# Patient Record
Sex: Male | Born: 1945 | Race: White | Hispanic: No | Smoking: Current every day smoker
Health system: Southern US, Community
[De-identification: ages and names within clinical notes are randomized; demographics above are authoritative.]

## PROBLEM LIST (undated history)

## (undated) DIAGNOSIS — I10 Essential (primary) hypertension: Secondary | ICD-10-CM

## (undated) DIAGNOSIS — M109 Gout, unspecified: Secondary | ICD-10-CM

## (undated) DIAGNOSIS — K219 Gastro-esophageal reflux disease without esophagitis: Secondary | ICD-10-CM

## (undated) HISTORY — DX: Gastro-esophageal reflux disease without esophagitis: K21.9

## (undated) HISTORY — DX: Gout, unspecified: M10.9

## (undated) HISTORY — DX: Essential (primary) hypertension: I10

---

## 1972-05-22 HISTORY — PX: RECONSTRUCTION OF NOSE: SHX2301

## 2012-06-06 LAB — PULMONARY FUNCTION TEST

## 2014-03-02 DIAGNOSIS — L120 Bullous pemphigoid: Secondary | ICD-10-CM | POA: Insufficient documentation

## 2014-08-04 DIAGNOSIS — G8929 Other chronic pain: Secondary | ICD-10-CM | POA: Diagnosis not present

## 2014-08-04 DIAGNOSIS — M549 Dorsalgia, unspecified: Secondary | ICD-10-CM | POA: Diagnosis not present

## 2014-08-04 DIAGNOSIS — M25562 Pain in left knee: Secondary | ICD-10-CM | POA: Diagnosis not present

## 2014-08-04 DIAGNOSIS — G894 Chronic pain syndrome: Secondary | ICD-10-CM | POA: Diagnosis not present

## 2014-09-01 DIAGNOSIS — L219 Seborrheic dermatitis, unspecified: Secondary | ICD-10-CM | POA: Diagnosis not present

## 2014-09-01 DIAGNOSIS — L120 Bullous pemphigoid: Secondary | ICD-10-CM | POA: Diagnosis not present

## 2014-09-01 DIAGNOSIS — L570 Actinic keratosis: Secondary | ICD-10-CM | POA: Diagnosis not present

## 2014-09-01 DIAGNOSIS — L304 Erythema intertrigo: Secondary | ICD-10-CM | POA: Diagnosis not present

## 2014-09-01 DIAGNOSIS — Z85828 Personal history of other malignant neoplasm of skin: Secondary | ICD-10-CM | POA: Diagnosis not present

## 2014-09-14 DIAGNOSIS — N400 Enlarged prostate without lower urinary tract symptoms: Secondary | ICD-10-CM | POA: Diagnosis not present

## 2014-10-23 DIAGNOSIS — Z5181 Encounter for therapeutic drug level monitoring: Secondary | ICD-10-CM | POA: Diagnosis not present

## 2014-10-23 DIAGNOSIS — M549 Dorsalgia, unspecified: Secondary | ICD-10-CM | POA: Diagnosis not present

## 2014-10-23 DIAGNOSIS — M25562 Pain in left knee: Secondary | ICD-10-CM | POA: Diagnosis not present

## 2014-10-23 DIAGNOSIS — Z79899 Other long term (current) drug therapy: Secondary | ICD-10-CM | POA: Diagnosis not present

## 2014-10-23 DIAGNOSIS — M25561 Pain in right knee: Secondary | ICD-10-CM | POA: Diagnosis not present

## 2014-10-23 DIAGNOSIS — G894 Chronic pain syndrome: Secondary | ICD-10-CM | POA: Diagnosis not present

## 2014-12-02 DIAGNOSIS — I10 Essential (primary) hypertension: Secondary | ICD-10-CM | POA: Diagnosis not present

## 2014-12-02 DIAGNOSIS — R42 Dizziness and giddiness: Secondary | ICD-10-CM | POA: Diagnosis not present

## 2014-12-02 DIAGNOSIS — R110 Nausea: Secondary | ICD-10-CM | POA: Diagnosis not present

## 2014-12-20 DIAGNOSIS — N400 Enlarged prostate without lower urinary tract symptoms: Secondary | ICD-10-CM | POA: Insufficient documentation

## 2014-12-20 DIAGNOSIS — F1120 Opioid dependence, uncomplicated: Secondary | ICD-10-CM | POA: Insufficient documentation

## 2014-12-20 DIAGNOSIS — L570 Actinic keratosis: Secondary | ICD-10-CM | POA: Insufficient documentation

## 2014-12-20 DIAGNOSIS — M48061 Spinal stenosis, lumbar region without neurogenic claudication: Secondary | ICD-10-CM | POA: Insufficient documentation

## 2014-12-20 NOTE — Progress Notes (Signed)
Subjective:  Patient ID: Todd Snow, male    DOB: 1945/06/18  Age: 69 y.o. MRN: [id number]  CC: Hypertension; Pain; and Gastrophageal Reflux   HPI Jayvin Hartt presents for follow up of back pain. Now seeing Pain clinic, Deana Smart, PA. He continues to use oxycodone per their supervision. Pain is daily and moderately severe relieved adequately by oxycodone.  Patient in for follow-up of elevated cholesterol. Doing well without complaints on current medication. Denies side effects of statin including myalgia and arthralgia and nausea. Also in today for liver function testing. Currently no chest pain, shortness of breath or other cardiovascular related symptoms noted.  Patient in for follow-up of GERD. Currently asymptomatic taking  PPI daily. There is no chest pain or heartburn. No hematemesis and no melena. No dysphagia or choking. Onset is remote. Progression is stable. Complicating factors, none. Patient in for follow-up of elevated cholesterol. Doing well without complaints on current medication. Denies side effects of statin including myalgia and arthralgia and nausea. Also in today for liver function testing. Currently no chest pain, shortness of breath or other cardiovascular related symptoms noted.  He recently saw urology for urinary hesitancy and nocturia. This has improved. However he could not tolerate Flomax and could not afford Rapaflo. He is now taking saw palmetto over-the-counter with good result.   History Taiten has a past medical history of GERD (gastroesophageal reflux disease) and Hypertension.   He has no past surgical history on file.   His family history includes COPD in his father; Cancer in his sister.He reports that he quit smoking about 15 years ago. His smoking use included Cigarettes. He started smoking about 45 years ago. He smoked 20.00 packs per day. He does not have any smokeless tobacco history on file. He reports that he drinks about 6.0 oz of alcohol per  week. He reports that he does not use illicit drugs.  No outpatient prescriptions prior to visit.   No facility-administered medications prior to visit.    ROS Review of Systems  Constitutional: Negative for fever, chills and diaphoresis.  HENT: Negative for congestion, rhinorrhea and sore throat.   Respiratory: Negative for cough, shortness of breath and wheezing.   Cardiovascular: Negative for chest pain.  Gastrointestinal: Positive for constipation (hard BMs for 3 weeks. Relief with otc stool softener). Negative for nausea, vomiting, abdominal pain, diarrhea and abdominal distention.  Genitourinary: Negative for dysuria and frequency.  Musculoskeletal: Positive for myalgias and joint swelling. Negative for arthralgias.  Skin: Negative for rash.  Neurological: Negative for headaches.    Objective:  BP 112/72 mmHg  Pulse 73  Temp(Src) 97.1 F (36.2 C) (Oral)  Ht 5\' 5"  (1.651 m)  Wt 195 lb 6.4 oz (88.633 kg)  BMI 32.52 kg/m2  BP Readings from Last 3 Encounters:  12/21/14 112/72    Wt Readings from Last 3 Encounters:  12/21/14 195 lb 6.4 oz (88.633 kg)     Physical Exam  Constitutional: He is oriented to person, place, and time. He appears well-developed and well-nourished. No distress.  HENT:  Head: Normocephalic and atraumatic.  Right Ear: External ear normal.  Left Ear: External ear normal.  Nose: Nose normal.  Mouth/Throat: Oropharynx is clear and moist.  Eyes: Conjunctivae and EOM are normal. Pupils are equal, round, and reactive to light.  Neck: Normal range of motion. Neck supple. No thyromegaly present.  Cardiovascular: Normal rate, regular rhythm and normal heart sounds.   No murmur heard. Pulmonary/Chest: Effort normal and breath sounds normal. No  respiratory distress. He has no wheezes. He has no rales.  Abdominal: Soft. Bowel sounds are normal. He exhibits no distension. There is no tenderness.  Lymphadenopathy:    He has no cervical adenopathy.    Neurological: He is alert and oriented to person, place, and time. He has normal reflexes.  Skin: Skin is warm and dry.  Psychiatric: He has a normal mood and affect. His behavior is normal. Judgment and thought content normal.    No results found for: HGBA1C  No results found for: WBC, HGB, HCT, PLT, GLUCOSE, CHOL, TRIG, HDL, LDLDIRECT, LDLCALC, ALT, AST, NA, K, CL, CREATININE, BUN, CO2, TSH, PSA, INR, GLUF, HGBA1C, MICROALBUR  Patient was never admitted.  Assessment & Plan:   Dimas was seen today for hypertension, pain and gastrophageal reflux.  Diagnoses and all orders for this visit:  BPH (benign prostatic hyperplasia)  Spinal stenosis of lumbar region  Opiate dependence, continuous  Essential hypertension, benign  Constipation, unspecified constipation type  Other orders -     psyllium (METAMUCIL SMOOTH TEXTURE) 28 % packet; One or two  tablespoons twice daily to treat and prevent constipation   I have discontinued Mr. Porter glucosamine-chondroitin and silodosin. I am also having him start on psyllium. Additionally, I am having him maintain his aspirin, hydrocortisone, naproxen sodium, ketoconazole, nystatin cream, omeprazole, Oxycodone HCl, sildenafil, triamcinolone cream, diazepam, and triamterene-hydrochlorothiazide.  Meds ordered this encounter  Medications  . aspirin 81 MG chewable tablet    Sig: Chew 81 mg by mouth daily.  Marland Kitchen DISCONTD: glucosamine-chondroitin 500-400 MG tablet    Sig: Take 1 tablet by mouth 3 (three) times daily.  . hydrocortisone 2.5 % cream    Sig: Apply 1 application topically 2 (two) times daily.  . naproxen sodium (ANAPROX) 220 MG tablet    Sig: Take 220 mg by mouth 2 (two) times daily.  Marland Kitchen ketoconazole (NIZORAL) 2 % shampoo    Sig: Apply 1 application topically 2 (two) times daily.  Marland Kitchen nystatin cream (MYCOSTATIN)    Sig: Use bid for groin as needed.  Marland Kitchen omeprazole (PRILOSEC) 20 MG capsule    Sig:   . Oxycodone HCl 10 MG TABS    Sig:  Take 10 mg by mouth.  . sildenafil (VIAGRA) 50 MG tablet    Sig: Take 50 mg by mouth as needed.  Marland Kitchen DISCONTD: silodosin (RAPAFLO) 8 MG CAPS capsule    Sig: Take 8 mg by mouth daily.  Marland Kitchen triamcinolone cream (KENALOG) 0.1 %    Sig: Apply twice daily  . diazepam (VALIUM) 10 MG tablet    Sig: Take 1 tablet by mouth 2 (two) times daily as needed. For anxiety    Refill:  2  . triamterene-hydrochlorothiazide (MAXZIDE-25) 37.5-25 MG per tablet    Sig: Take 1 tablet by mouth daily.    Refill:  0  . psyllium (METAMUCIL SMOOTH TEXTURE) 28 % packet    Sig: One or two  tablespoons twice daily to treat and prevent constipation    Dispense:  1000 packet    Refill:  11     Follow-up: Return in about 6 months (around 06/23/2015).  Mechele Claude, M.D.

## 2014-12-21 ENCOUNTER — Encounter: Payer: Self-pay | Admitting: Family Medicine

## 2014-12-21 ENCOUNTER — Encounter (INDEPENDENT_AMBULATORY_CARE_PROVIDER_SITE_OTHER): Payer: Self-pay

## 2014-12-21 ENCOUNTER — Ambulatory Visit (INDEPENDENT_AMBULATORY_CARE_PROVIDER_SITE_OTHER): Payer: Medicare Other | Admitting: Family Medicine

## 2014-12-21 VITALS — BP 112/72 | HR 73 | Temp 97.1°F | Ht 65.0 in | Wt 195.4 lb

## 2014-12-21 DIAGNOSIS — M4806 Spinal stenosis, lumbar region: Secondary | ICD-10-CM

## 2014-12-21 DIAGNOSIS — K5900 Constipation, unspecified: Secondary | ICD-10-CM

## 2014-12-21 DIAGNOSIS — I10 Essential (primary) hypertension: Secondary | ICD-10-CM

## 2014-12-21 DIAGNOSIS — N400 Enlarged prostate without lower urinary tract symptoms: Secondary | ICD-10-CM | POA: Diagnosis not present

## 2014-12-21 DIAGNOSIS — F1120 Opioid dependence, uncomplicated: Secondary | ICD-10-CM

## 2014-12-21 DIAGNOSIS — M48061 Spinal stenosis, lumbar region without neurogenic claudication: Secondary | ICD-10-CM

## 2014-12-21 MED ORDER — PSYLLIUM 28 % PO PACK: PACK | ORAL | Status: DC

## 2014-12-25 ENCOUNTER — Other Ambulatory Visit: Payer: Self-pay | Admitting: Family Medicine

## 2014-12-25 MED ORDER — TRIAMTERENE-HCTZ 37.5-25 MG PO TABS: 1.0000 | ORAL_TABLET | Freq: Every day | ORAL | Status: DC

## 2014-12-25 NOTE — Telephone Encounter (Signed)
Medication refilled and pt notified.

## 2015-01-22 DIAGNOSIS — M25561 Pain in right knee: Secondary | ICD-10-CM | POA: Diagnosis not present

## 2015-01-22 DIAGNOSIS — M549 Dorsalgia, unspecified: Secondary | ICD-10-CM | POA: Diagnosis not present

## 2015-01-22 DIAGNOSIS — M25562 Pain in left knee: Secondary | ICD-10-CM | POA: Diagnosis not present

## 2015-01-22 DIAGNOSIS — G894 Chronic pain syndrome: Secondary | ICD-10-CM | POA: Diagnosis not present

## 2015-03-01 DIAGNOSIS — H527 Unspecified disorder of refraction: Secondary | ICD-10-CM | POA: Diagnosis not present

## 2015-03-04 DIAGNOSIS — L821 Other seborrheic keratosis: Secondary | ICD-10-CM | POA: Diagnosis not present

## 2015-03-04 DIAGNOSIS — L570 Actinic keratosis: Secondary | ICD-10-CM | POA: Diagnosis not present

## 2015-03-04 DIAGNOSIS — L723 Sebaceous cyst: Secondary | ICD-10-CM | POA: Diagnosis not present

## 2015-03-04 DIAGNOSIS — L120 Bullous pemphigoid: Secondary | ICD-10-CM | POA: Diagnosis not present

## 2015-03-04 DIAGNOSIS — Z85828 Personal history of other malignant neoplasm of skin: Secondary | ICD-10-CM | POA: Diagnosis not present

## 2015-03-25 DIAGNOSIS — L723 Sebaceous cyst: Secondary | ICD-10-CM | POA: Diagnosis not present

## 2015-03-25 DIAGNOSIS — L989 Disorder of the skin and subcutaneous tissue, unspecified: Secondary | ICD-10-CM | POA: Diagnosis not present

## 2015-03-25 DIAGNOSIS — L219 Seborrheic dermatitis, unspecified: Secondary | ICD-10-CM | POA: Diagnosis not present

## 2015-03-25 DIAGNOSIS — W57XXXA Bitten or stung by nonvenomous insect and other nonvenomous arthropods, initial encounter: Secondary | ICD-10-CM | POA: Diagnosis not present

## 2015-03-26 ENCOUNTER — Other Ambulatory Visit: Payer: Self-pay | Admitting: Family Medicine

## 2015-04-09 ENCOUNTER — Encounter: Payer: Self-pay | Admitting: Pediatrics

## 2015-04-09 ENCOUNTER — Ambulatory Visit (INDEPENDENT_AMBULATORY_CARE_PROVIDER_SITE_OTHER): Payer: Medicare Other

## 2015-04-09 ENCOUNTER — Ambulatory Visit (INDEPENDENT_AMBULATORY_CARE_PROVIDER_SITE_OTHER): Payer: Medicare Other | Admitting: Pediatrics

## 2015-04-09 VITALS — BP 138/96 | HR 93 | Temp 97.0°F | Ht 65.0 in | Wt 195.6 lb

## 2015-04-09 DIAGNOSIS — K5900 Constipation, unspecified: Secondary | ICD-10-CM

## 2015-04-09 DIAGNOSIS — R1084 Generalized abdominal pain: Secondary | ICD-10-CM

## 2015-04-09 DIAGNOSIS — K5730 Diverticulosis of large intestine without perforation or abscess without bleeding: Secondary | ICD-10-CM | POA: Diagnosis not present

## 2015-04-09 DIAGNOSIS — K567 Ileus, unspecified: Secondary | ICD-10-CM | POA: Diagnosis not present

## 2015-04-09 DIAGNOSIS — Z880 Allergy status to penicillin: Secondary | ICD-10-CM | POA: Diagnosis not present

## 2015-04-09 DIAGNOSIS — Z87891 Personal history of nicotine dependence: Secondary | ICD-10-CM | POA: Diagnosis not present

## 2015-04-09 DIAGNOSIS — Z888 Allergy status to other drugs, medicaments and biological substances status: Secondary | ICD-10-CM | POA: Diagnosis not present

## 2015-04-09 DIAGNOSIS — Z885 Allergy status to narcotic agent status: Secondary | ICD-10-CM | POA: Diagnosis not present

## 2015-04-09 DIAGNOSIS — E876 Hypokalemia: Secondary | ICD-10-CM | POA: Diagnosis not present

## 2015-04-09 DIAGNOSIS — K5660 Unspecified intestinal obstruction: Secondary | ICD-10-CM | POA: Diagnosis not present

## 2015-04-09 DIAGNOSIS — N281 Cyst of kidney, acquired: Secondary | ICD-10-CM | POA: Diagnosis not present

## 2015-04-09 DIAGNOSIS — E785 Hyperlipidemia, unspecified: Secondary | ICD-10-CM | POA: Diagnosis not present

## 2015-04-09 DIAGNOSIS — Z85828 Personal history of other malignant neoplasm of skin: Secondary | ICD-10-CM | POA: Diagnosis not present

## 2015-04-09 DIAGNOSIS — S21219A Laceration without foreign body of unspecified back wall of thorax without penetration into thoracic cavity, initial encounter: Secondary | ICD-10-CM

## 2015-04-09 DIAGNOSIS — IMO0002 Reserved for concepts with insufficient information to code with codable children: Secondary | ICD-10-CM

## 2015-04-09 DIAGNOSIS — K219 Gastro-esophageal reflux disease without esophagitis: Secondary | ICD-10-CM | POA: Diagnosis not present

## 2015-04-09 DIAGNOSIS — R188 Other ascites: Secondary | ICD-10-CM | POA: Diagnosis not present

## 2015-04-09 DIAGNOSIS — K5669 Other intestinal obstruction: Secondary | ICD-10-CM | POA: Diagnosis not present

## 2015-04-09 LAB — CBC
HEMATOCRIT: 47.8 % (ref 37.5–51.0)
Hemoglobin: 17 g/dL (ref 12.6–17.7)
MCH: 33.1 pg — ABNORMAL HIGH (ref 26.6–33.0)
MCHC: 35.6 g/dL (ref 31.5–35.7)
MCV: 93 fL (ref 79–97)
PLATELETS: 325 10*3/uL (ref 150–379)
RBC: 5.14 x10E6/uL (ref 4.14–5.80)
RDW: 13.1 % (ref 12.3–15.4)
WBC: 14.6 10*3/uL — AB (ref 3.4–10.8)

## 2015-04-09 LAB — CMP14+EGFR
ALBUMIN: 4.6 g/dL (ref 3.5–4.8)
ALK PHOS: 91 IU/L (ref 39–117)
ALT: 16 IU/L (ref 0–44)
AST: 18 IU/L (ref 0–40)
Albumin/Globulin Ratio: 1.9 (ref 1.1–2.5)
BILIRUBIN TOTAL: 1 mg/dL (ref 0.0–1.2)
BUN / CREAT RATIO: 14 (ref 10–22)
BUN: 14 mg/dL (ref 8–27)
CHLORIDE: 94 mmol/L — AB (ref 97–106)
CO2: 32 mmol/L — AB (ref 18–29)
CREATININE: 0.97 mg/dL (ref 0.76–1.27)
Calcium: 9.6 mg/dL (ref 8.6–10.2)
GFR calc Af Amer: 90 mL/min/{1.73_m2} (ref >59)
GFR calc non Af Amer: 78 mL/min/{1.73_m2} (ref >59)
GLUCOSE: 140 mg/dL — AB (ref 65–99)
Globulin, Total: 2.4 g/dL (ref 1.5–4.5)
Potassium: 4 mmol/L (ref 3.5–5.2)
SODIUM: 136 mmol/L (ref 136–144)
Total Protein: 7 g/dL (ref 6.0–8.5)

## 2015-04-09 NOTE — Progress Notes (Signed)
Subjective:    Patient ID: Todd Snow, male    DOB: 28-Aug-1945, 69 y.o.   MRN: [id number]  CC: abd pain  HPI: Todd Snow is a 69 y.o. male presenting on 04/09/2015 for Constipation; Nausea; and Emesis  No stool in last 6 days Drank prune juice last night, didn't help Passed gas twice this morning No history of surgery in belly No fevers Belly hurting, when he lays down at night hears it gurgling No problems with constipation prior to this Takes oxycodone 10m in a day Hasnt eaten anything in past three days Tried to eat some soup Wednesday night, felt sick Small amount of bright green emesis in office today One other episode of emesis a few days ago was not green Has some miralax at home hasnt taken it   Relevant past medical, surgical, family and social history reviewed and updated as indicated. Interim medical history since our last visit reviewed. Allergies and medications reviewed and updated.   ROS: Per HPI unless specifically indicated above  Past Medical History Patient Active Problem List   Diagnosis Date Noted  . BPH (benign prostatic hyperplasia) 12/20/2014  . Actinic keratosis 12/20/2014  . Spinal stenosis of lumbar region 12/20/2014  . [place] 12/[place]  . 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 aspirin 81 MG chewable tablet Chew 81 mg by mouth daily.    . diazepam (VALIUM) 10 MG tablet Take 1 tablet by mouth 2 (two) times daily as needed. For anxiety  2  . hydrocortisone 2.5 % cream Apply 1 application topically 2 (two) times daily.    .Marland Kitchenketoconazole (NIZORAL) 2 % shampoo Apply 1 application topically 2 (two) times daily.    . naproxen sodium (ANAPROX) 220 MG tablet Take 220 mg by mouth 2 (two) times daily.    . Oxycodone HCl 10 MG TABS Take 10 mg by mouth.    . psyllium (METAMUCIL SMOOTH TEXTURE) 28 % packet One or two  tablespoons twice daily to treat and  prevent constipation 1000 packet 11  . triamcinolone cream (KENALOG) 0.1 % Apply twice daily    . triamterene-hydrochlorothiazide (MAXZIDE-25) 37.5-25 MG tablet TAKE 1 TABLET BY MOUTH DAILY. 30 tablet 0  . nystatin cream (MYCOSTATIN) Use bid for groin as needed.    .Marland Kitchenomeprazole (PRILOSEC) 20 MG capsule     . sildenafil (VIAGRA) 50 MG tablet Take 50 mg by mouth as needed.     No current facility-administered medications for this visit.       Objective:    BP 138/96 mmHg  Pulse 93  Temp(Src) 97 F (36.1 C) (Oral)  Ht 5' 5"  (1.651 m)  Wt 195 lb 9.6 oz (88.724 kg)  BMI 32.55 kg/m2  Wt Readings from Last 3 Encounters:  04/09/15 195 lb 9.6 oz (88.724 kg)  12/21/14 195 lb 6.4 oz (88.633 kg)    Gen: NAD, uncomfortable appearing, alert, cooperative with exam, NCAT EYES: EOMI, no scleral injection or icterus ENT:  OP without erythema LYMPH: no cervical LAD CV: NRRR, normal S1/S2, no murmur, distal pulses 2+ b/l Resp: CTABL, no wheezes, normal WOB Abd: +BS, distended but soft, tender throughout with gentle palpation. no guarding. No rebound tenderness Ext: No edema, warm Neuro: Alert and oriented, strength equal b/l UE and LE, coordination grossly normal  KUB read: "Increase colonic stool burden may reflect clinical constipation. Distention of small-bowel loops with gas is consistent with a mid  to distal small bowel obstructive process. This may be related to the process resulting in increased stool burden. Abdominal and pelvic CT scanning would be useful."     Assessment & Plan:   Todd Snow was seen today for constipation, nausea and bright green emesis. No air fluid levels on plain film though with distended loops of small bowel. Passing gas this morning. Not able to tolerate any PO per pt for 3 days, even fluids he has been avoiding due to nausea. Tried to order CT scan though labs are not back yet for creatinine clearance to get contrast. Needs contrast for obstruction eval. Discussed  with patient, want him to go to the ED for further evaluation and likely CT scan.  Gave bowel regimen for use at home when well, has not been using anything regularly and on chronic narcotic therapy.  Diagnoses and all orders for this visit:  Intestinal obstruction, unspecified type ([place])  Generalized abdominal pain -     DG Abd 1 View; Future -     CMP14+EGFR -     CBC  Constipation, unspecified constipation type   PROCEDURE: 5 sutures removed from well-healing laceration on mid-back. Pt tolerated procedure well.   Follow up plan: Return if symptoms worsen or fail to improve.  Assunta Found, MD [facility] 04/09/2015, 12:46 PM

## 2015-04-09 NOTE — Patient Instructions (Addendum)
miralax every 1-2 hours until stools are clear  Docusate stool softener 100mg  twice a day  Drink lots of fluids

## 2015-04-14 DIAGNOSIS — Z5181 Encounter for therapeutic drug level monitoring: Secondary | ICD-10-CM | POA: Diagnosis not present

## 2015-04-14 DIAGNOSIS — M25561 Pain in right knee: Secondary | ICD-10-CM | POA: Diagnosis not present

## 2015-04-14 DIAGNOSIS — M549 Dorsalgia, unspecified: Secondary | ICD-10-CM | POA: Diagnosis not present

## 2015-04-14 DIAGNOSIS — Z79899 Other long term (current) drug therapy: Secondary | ICD-10-CM | POA: Diagnosis not present

## 2015-04-14 DIAGNOSIS — G894 Chronic pain syndrome: Secondary | ICD-10-CM | POA: Diagnosis not present

## 2015-04-14 DIAGNOSIS — M25562 Pain in left knee: Secondary | ICD-10-CM | POA: Diagnosis not present

## 2015-04-23 ENCOUNTER — Ambulatory Visit (INDEPENDENT_AMBULATORY_CARE_PROVIDER_SITE_OTHER): Payer: Medicare Other | Admitting: Pediatrics

## 2015-04-23 ENCOUNTER — Encounter: Payer: Self-pay | Admitting: Pediatrics

## 2015-04-23 VITALS — BP 109/74 | HR 86 | Temp 97.5°F | Ht 65.0 in | Wt 196.0 lb

## 2015-04-23 DIAGNOSIS — I10 Essential (primary) hypertension: Secondary | ICD-10-CM | POA: Diagnosis not present

## 2015-04-23 DIAGNOSIS — K219 Gastro-esophageal reflux disease without esophagitis: Secondary | ICD-10-CM | POA: Diagnosis not present

## 2015-04-23 DIAGNOSIS — Z8719 Personal history of other diseases of the digestive system: Secondary | ICD-10-CM | POA: Diagnosis not present

## 2015-04-23 DIAGNOSIS — K5900 Constipation, unspecified: Secondary | ICD-10-CM

## 2015-04-23 MED ORDER — ESOMEPRAZOLE MAGNESIUM 40 MG PO PACK: 40.0000 mg | PACK | Freq: Every day | ORAL | Status: DC

## 2015-04-23 NOTE — Progress Notes (Signed)
Subjective:    Patient ID: Todd Snow, male    DOB: 1945/10/01, 69 y.o.   MRN: [id number]  CC: Follow-up abd pain, recent hospitalization  HPI: Todd Snow is a 69 y.o. male presenting for Follow-up Pt here for f/u after a recent hospitalization for a small bowel obstruction, he did require surgery. He is feeling much better now. I have reviewed hospital records He has been on chronic narcotic therapy for many years per pt for chronic pain syndrome, low back pain and OA of knees, currently followed by Dr. Jimmye Norman, last appt was last week. He usually does have regular bowel movements every other day if not every day but doesn't always take stool softener Now appetite has been much improved Thinking about going away this weekend to the [employer] with abd pain, passing stool without difficulty No lightheadedness when he stands up, no HA or vision changes Takes HTN meds daily No fevers No abd pain, N/V One of his incision sites did stat to bleed slightly within the last few days so he has been keeping a bandaid on it GERD symptoms are well controlled as long as he takes PPI, if only  sometimes he will still have symptoms when he lies down  Depression screen [facility] 2/9 04/23/2015 04/09/2015 12/21/2014  Decreased Interest 0 0 0  Down, Depressed, Hopeless 0 0 0  PHQ - 2 Score 0 0 0     Relevant past medical, surgical, family and social history reviewed and updated as indicated. Interim medical history since our last visit reviewed. Allergies and medications reviewed and updated.    ROS: Per HPI unless specifically indicated above  History  Smoking status  . Former Smoker -- 20.00 packs/day  . Types: Cigarettes  . Start date: 12/20/1969  . Quit date: 12/21/1999  Smokeless tobacco  . Not on file    Past Medical History Patient Active Problem List   Diagnosis Date Noted  . Essential hypertension 04/23/2015  . Constipation 04/23/2015  . BPH (benign prostatic  hyperplasia) 12/20/2014  . Actinic keratosis 12/20/2014  . Spinal stenosis of lumbar region 12/20/2014  . Opiate dependence, continuous (HCC) 12/20/2014  . 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 aspirin 81 MG chewable tablet Chew 81 mg by mouth daily.    . diazepam (VALIUM) 10 MG tablet Take 1 tablet by mouth 2 (two) times daily as needed. For anxiety  2  . hydrocortisone 2.5 % cream Apply 1 application topically 2 (two) times daily.    Marland Kitchen ketoconazole (NIZORAL) 2 % shampoo Apply 1 application topically 2 (two) times daily.    . naproxen sodium (ANAPROX) 220 MG tablet Take 220 mg by mouth 2 (two) times daily.    Marland Kitchen nystatin cream (MYCOSTATIN) Use bid for groin as needed.    Marland Kitchen omeprazole (PRILOSEC) 20 MG capsule     . Oxycodone HCl 10 MG TABS Take 10 mg by mouth.    . psyllium (METAMUCIL SMOOTH TEXTURE) 28 % packet One or two  tablespoons twice daily to treat and prevent constipation 1000 packet 11  . sildenafil (VIAGRA) 50 MG tablet Take 50 mg by mouth as needed.    . triamcinolone cream (KENALOG) 0.1 % Apply twice daily    . triamterene-hydrochlorothiazide (MAXZIDE-25) 37.5-25 MG tablet TAKE 1 TABLET BY MOUTH DAILY. 30 tablet 0   No current facility-administered medications for this visit.       Objective:  BP 109/74 mmHg  Pulse 86  Temp(Src) 97.5 F (36.4 C) (Oral)  Ht 5\' 5"  (1.651 m)  Wt 196 lb (88.905 kg)  BMI 32.62 kg/m2  Wt Readings from Last 3 Encounters:  04/23/15 196 lb (88.905 kg)  04/09/15 195 lb 9.6 oz (88.724 kg)  12/21/14 195 lb 6.4 oz (88.633 kg)     Gen: NAD, alert, cooperative with exam, NCAT EYES: EOMI, no scleral injection or icterus ENT:  TMs pearly gray b/l, OP without erythema LYMPH: no cervical LAD CV: NRRR, normal S1/S2, no murmur, distal pulses 2+ b/l Resp: CTABL, no wheezes, normal WOB Abd: +BS, soft, NTND. no guarding or organomegaly Ext: No edema, warm Neuro: Alert and  oriented, strength equal b/l UE and LE, coordination grossly normal MSK: normal muscle bulk Skin: 3 well healing incisions apprx 1 cm in diameter on abdomen. 2 completely closed, one with slight amount crusted blood, no active bleeding, no surrounding erythema or discharge.     Assessment & Plan:    Todd Snow was seen today for follow-up of recent hospitaliation for small bowel obstruction and mulitple med problems. Overall doing well.   Diagnoses and all orders for this visit:  Essential hypertension HTN well controlled today, continue current medicines, if BP stays low may need decrease in medicine but asymptomatic now.  Constipation, unspecified constipation type Discussed taking stool softener daily to maintain regular bowel movements.  Gastroesophageal reflux disease, esophagitis presence not specified Continue PPI for GERD.  H/O small bowel obstruction Appetite back to normal, incisions healing well.    Follow up plan: Return in about 5 weeks (around 05/28/2015).  Siyon Krasarol Ira Dougher, MD [facility] 04/23/2015, 2:40 PM

## 2015-04-25 ENCOUNTER — Other Ambulatory Visit: Payer: Self-pay | Admitting: Family Medicine

## 2015-05-28 ENCOUNTER — Ambulatory Visit (INDEPENDENT_AMBULATORY_CARE_PROVIDER_SITE_OTHER): Payer: Medicare Other | Admitting: Pediatrics

## 2015-05-28 ENCOUNTER — Encounter: Payer: Self-pay | Admitting: Pediatrics

## 2015-05-28 VITALS — BP 119/77 | HR 85 | Temp 97.5°F | Ht 65.0 in | Wt 198.4 lb

## 2015-05-28 DIAGNOSIS — I10 Essential (primary) hypertension: Secondary | ICD-10-CM | POA: Diagnosis not present

## 2015-05-28 DIAGNOSIS — J069 Acute upper respiratory infection, unspecified: Secondary | ICD-10-CM | POA: Diagnosis not present

## 2015-05-28 DIAGNOSIS — K5900 Constipation, unspecified: Secondary | ICD-10-CM | POA: Diagnosis not present

## 2015-05-28 DIAGNOSIS — N400 Enlarged prostate without lower urinary tract symptoms: Secondary | ICD-10-CM

## 2015-05-28 NOTE — Progress Notes (Signed)
Subjective:    Patient ID: Todd Snow, male    DOB: [birth year], 70 y.o.   MRN: [id number]  CC: Follow-up   HPI: Todd Snow is a 70 y.o. male presenting for Follow-up  Had URI symptoms for past week Sinus pressure Took amoxicillin he had at home Now feeling better Coughing some still, not keeping him awake at night  Oxycodone 10mg  4-8 times a day. Sees pain clinic, Dr. Barrie Dunker Takes three stool softeners every night Having regular bowel movements  Takes BPs at home regularly. Have been well controlled, 120s-130s regularly.   Depression screen [facility] 2/9 05/28/2015 04/23/2015 04/09/2015 12/21/2014  Decreased Interest 0 0 0 0  Down, Depressed, Hopeless 0 0 0 0  PHQ - 2 Score 0 0 0 0     Relevant past medical, surgical, family and social history reviewed and updated as indicated. Interim medical history since our last visit reviewed. Allergies and medications reviewed and updated.    ROS: Per HPI unless specifically indicated above  History  Smoking status  . Former Smoker -- 20.00 packs/day  . Types: Cigarettes  . Start date: 12/20/1969  . Quit date: 12/21/1999  Smokeless tobacco  . Not on file    Past Medical History Patient Active Problem List   Diagnosis Date Noted  . Essential hypertension 04/23/2015  . Constipation 04/23/2015  . BPH (benign prostatic hyperplasia) 12/20/2014  . Actinic keratosis 12/20/2014  . Spinal stenosis of lumbar region 12/20/2014  . Opiate dependence, continuous (HCC) 12/20/2014  . 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 aspirin 81 MG chewable tablet Chew 81 mg by mouth daily.    . diazepam (VALIUM) 10 MG tablet Take 1 tablet by mouth 2 (two) times daily as needed. For anxiety  2  . hydrocortisone 2.5 % cream Apply 1 application topically 2 (two) times daily.    Marland Kitchen ketoconazole (NIZORAL) 2 % shampoo Apply 1 application topically 2 (two) times daily.    . naproxen  sodium (ANAPROX) 220 MG tablet Take 220 mg by mouth 2 (two) times daily.    Marland Kitchen nystatin cream (MYCOSTATIN) Use bid for groin as needed.    Marland Kitchen omeprazole (PRILOSEC) 20 MG capsule     . Oxycodone HCl 10 MG TABS TAKE 1 TO 2 TABLETS BY MOUTH 4 TIMES A DAY  0  . psyllium (METAMUCIL SMOOTH TEXTURE) 28 % packet One or two  tablespoons twice daily to treat and prevent constipation 1000 packet 11  . sildenafil (VIAGRA) 50 MG tablet Take 50 mg by mouth as needed.    . triamcinolone cream (KENALOG) 0.1 % Apply twice daily    . triamterene-hydrochlorothiazide (MAXZIDE-25) 37.5-25 MG tablet TAKE 1 TABLET BY MOUTH DAILY. 30 tablet 5   No current facility-administered medications for this visit.       Objective:    BP 119/77 mmHg  Pulse 85  Temp(Src) 97.5 F (36.4 C) (Oral)  Ht 5\' 5"  (1.651 m)  Wt 198 lb 6.4 oz (89.994 kg)  BMI 33.02 kg/m2  Wt Readings from Last 3 Encounters:  05/28/15 198 lb 6.4 oz (89.994 kg)  04/23/15 196 lb (88.905 kg)  04/09/15 195 lb 9.6 oz (88.724 kg)     Gen: NAD, alert, cooperative with exam, NCAT EYES: EOMI, no scleral injection or icterus ENT:  OP without erythema LYMPH: no cervical LAD CV: NRRR, normal S1/S2, no murmur, distal pulses 2+ b/l Resp: CTABL, no wheezes, normal WOB  Abd: +BS, soft, NTND. no guarding or organomegaly Ext: No edema, warm Neuro: Alert and oriented, strength equal b/l UE and LE, coordination grossly normal MSK: normal muscle bulk     Assessment & Plan:    Todd Snow was seen today for follow-up multiple med problems and URI.  Diagnoses and all orders for this visit:  Acute URI Improving. Continue symptomatic care. Pt finished his amoxicillin for possible sinusitis, started 7 days after his symptoms began.  Essential hypertension BPs wellcontrolled at home, no dizziness or lightheadedness when he stands up  BPH (benign prostatic hyperplasia) Steady stream now, symptoms well controlled. Has been on flomax in the past, not on it  now.  Constipation, unspecified constipation type Continue stool softeners, goal daily stooling  Follow up plan: Return in about 6 months (around 11/25/2015).  Ozie Krasarol Vincent, MD [facility] 05/28/2015, 2:39 PM

## 2015-06-23 ENCOUNTER — Ambulatory Visit: Payer: Medicare Other | Admitting: Family Medicine

## 2015-08-20 ENCOUNTER — Other Ambulatory Visit: Payer: Self-pay | Admitting: Pediatrics

## 2015-09-30 ENCOUNTER — Other Ambulatory Visit: Payer: Self-pay

## 2015-09-30 MED ORDER — TRIAMTERENE-HCTZ 37.5-25 MG PO TABS: 1.0000 | ORAL_TABLET | Freq: Every day | ORAL | Status: DC

## 2015-10-25 ENCOUNTER — Encounter: Payer: Self-pay | Admitting: Family Medicine

## 2015-10-25 ENCOUNTER — Ambulatory Visit (INDEPENDENT_AMBULATORY_CARE_PROVIDER_SITE_OTHER): Payer: Medicare Other | Admitting: Family Medicine

## 2015-10-25 VITALS — BP 104/79 | HR 81 | Temp 97.4°F | Ht 65.0 in | Wt 202.4 lb

## 2015-10-25 DIAGNOSIS — K5900 Constipation, unspecified: Secondary | ICD-10-CM | POA: Diagnosis not present

## 2015-10-25 DIAGNOSIS — M545 Low back pain: Secondary | ICD-10-CM | POA: Diagnosis not present
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
## 2015-11-26 ENCOUNTER — Ambulatory Visit (INDEPENDENT_AMBULATORY_CARE_PROVIDER_SITE_OTHER): Payer: Medicare Other | Admitting: Pediatrics

## 2015-11-26 ENCOUNTER — Encounter: Payer: Self-pay | Admitting: Pediatrics

## 2015-11-26 DIAGNOSIS — Z1159 Encounter for screening for other viral diseases: Secondary | ICD-10-CM

## 2015-11-26 DIAGNOSIS — N400 Enlarged prostate without lower urinary tract symptoms: Secondary | ICD-10-CM | POA: Diagnosis not present

## 2015-11-26 DIAGNOSIS — K5900 Constipation, unspecified: Secondary | ICD-10-CM

## 2015-11-26 DIAGNOSIS — F1120 Opioid dependence, uncomplicated: Secondary | ICD-10-CM

## 2015-11-26 DIAGNOSIS — I10 Essential (primary) hypertension: Secondary | ICD-10-CM | POA: Diagnosis not present

## 2015-11-26 DIAGNOSIS — E785 Hyperlipidemia, unspecified: Secondary | ICD-10-CM | POA: Diagnosis not present

## 2015-11-26 DIAGNOSIS — Z1211 Encounter for screening for malignant neoplasm of colon: Secondary | ICD-10-CM
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
## 2015-11-27 LAB — PSA, TOTAL AND FREE
PROSTATE SPECIFIC AG, SERUM: 1.8 ng/mL (ref 0.0–4.0)
PSA FREE PCT: 20 %
PSA, Free: 0.36 ng/mL

## 2015-11-27 LAB — BMP8+EGFR
BUN/Creatinine Ratio: 13 (ref 10–24)
BUN: 11 mg/dL (ref 8–27)
CALCIUM: 8.7 mg/dL (ref 8.6–10.2)
CHLORIDE: 99 mmol/L (ref 96–106)
CO2: 25 mmol/L (ref 18–29)
Creatinine, Ser: 0.86 mg/dL (ref 0.76–1.27)
GFR calc non Af Amer: 87 mL/min/{1.73_m2} (ref >59)
GFR, EST AFRICAN AMERICAN: 100 mL/min/{1.73_m2} (ref >59)
GLUCOSE: 104 mg/dL — AB (ref 65–99)
POTASSIUM: 4.2 mmol/L (ref 3.5–5.2)
Sodium: 141 mmol/L (ref 134–144)

## 2015-11-27 LAB — LIPID PANEL
CHOLESTEROL TOTAL: 166 mg/dL (ref 100–199)
Chol/HDL Ratio: 3 ratio units (ref 0.0–5.0)
HDL: 56 mg/dL (ref >39)
LDL CALC: 99 mg/dL (ref 0–99)
Triglycerides: 57 mg/dL (ref 0–149)
VLDL CHOLESTEROL CAL: 11 mg/dL (ref 5–40)

## 2015-11-27 LAB — HEPATITIS C ANTIBODY: Hep C Virus Ab: <0.1 s/co ratio (ref 0.0–0.9)

## 2015-12-29 ENCOUNTER — Ambulatory Visit (INDEPENDENT_AMBULATORY_CARE_PROVIDER_SITE_OTHER): Payer: Medicare Other | Admitting: Pediatrics

## 2015-12-29 ENCOUNTER — Encounter: Payer: Self-pay | Admitting: Pediatrics

## 2015-12-29 VITALS — BP 139/84 | HR 76 | Temp 97.0°F | Ht 65.0 in | Wt 192.8 lb

## 2015-12-29 DIAGNOSIS — M25522 Pain in left elbow: Secondary | ICD-10-CM | POA: Diagnosis not present

## 2015-12-29 DIAGNOSIS — I10 Essential (primary) hypertension: Secondary | ICD-10-CM

## 2015-12-29 DIAGNOSIS — N400 Enlarged prostate without lower urinary tract symptoms: Secondary | ICD-10-CM

## 2015-12-29 DIAGNOSIS — N529 Male erectile dysfunction, unspecified: Secondary | ICD-10-CM

## 2015-12-29 MED ORDER — MELOXICAM 15 MG PO TABS: 15.0000 mg | ORAL_TABLET | Freq: Every day | ORAL | 2 refills | Status: DC

## 2015-12-29 MED ORDER — DOXAZOSIN MESYLATE 1 MG PO TABS: 1.0000 mg | ORAL_TABLET | Freq: Every day | ORAL | 2 refills | Status: DC

## 2015-12-29 NOTE — Patient Instructions (Addendum)
Lateral Epicondylitis With Rehab Lateral epicondylitis involves inflammation and pain around the outer portion of the elbow. The pain is caused by inflammation of the tendons in the forearm that bring back (extend) the wrist. Lateral epicondylitis is also called tennis elbow, because it is very common in tennis players. However, it may occur in any individual who extends the wrist repetitively. If lateral epicondylitis is left untreated, it may become a chronic problem. SYMPTOMS   Pain, tenderness, and inflammation on the outer (lateral) side of the elbow.  Pain or weakness with gripping activities.  Pain that increases with wrist-twisting motions (playing tennis, using a screwdriver, opening a door or a jar).  Pain with lifting objects, including a coffee cup. CAUSES  Lateral epicondylitis is caused by inflammation of the tendons that extend the wrist. Causes of injury may include:  Repetitive stress and strain on the muscles and tendons that extend the wrist.  Sudden change in activity level or intensity.  Incorrect grip in racquet sports.  Incorrect grip size of racquet (often too large).  Incorrect hitting position or technique (usually backhand, leading with the elbow).  Using a racket that is too heavy. RISK INCREASES WITH:  Sports or occupations that require repetitive and/or strenuous forearm and wrist movements (tennis, squash, racquetball, carpentry).  Poor wrist and forearm strength and flexibility.  Failure to warm up properly before activity.  Resuming activity before healing, rehabilitation, and conditioning are complete. PREVENTION   Warm up and stretch properly before activity.  Maintain physical fitness:  Strength, flexibility, and endurance.  Cardiovascular fitness.  Wear and use properly fitted equipment.  Learn and use proper technique and have a coach correct improper technique.  Wear a tennis elbow (counterforce) brace. PROGNOSIS  The course of  this condition depends on the degree of the injury. If treated properly, acute cases (symptoms lasting less than 4 weeks) are often resolved in 2 to 6 weeks. Chronic (longer lasting cases) often resolve in 3 to 6 months but may require physical therapy. RELATED COMPLICATIONS   Frequently recurring symptoms, resulting in a chronic problem. Properly treating the problem the first time decreases frequency of recurrence.  Chronic inflammation, scarring tendon degeneration, and partial tendon tear, requiring surgery.  Delayed healing or resolution of symptoms. TREATMENT  Treatment first involves the use of ice and medicine to reduce pain and inflammation. Strengthening and stretching exercises may help reduce discomfort if performed regularly. These exercises may be performed at home if the condition is an acute injury. Chronic cases may require a referral to a physical therapist for evaluation and treatment. Your caregiver may advise a corticosteroid injection to help reduce inflammation. Rarely, surgery is needed. MEDICATION  If pain medicine is needed, nonsteroidal anti-inflammatory medicines (aspirin and ibuprofen), or other minor pain relievers (acetaminophen), are often advised.  Do not take pain medicine for 7 days before surgery.  Prescription pain relievers may be given, if your caregiver thinks they are needed. Use only as directed and only as much as you need.  Corticosteroid injections may be recommended. These injections should be reserved only for the most severe cases, because they can only be given a certain number of times. HEAT AND COLD  Cold treatment (icing) should be applied for 10 to 15 minutes every 2 to 3 hours for inflammation and pain, and immediately after activity that aggravates your symptoms. Use ice packs or an ice massage.  Heat treatment may be used before performing stretching and strengthening activities prescribed by your caregiver, physical therapist,   or  athletic trainer. Use a heat pack or a warm water soak. SEEK MEDICAL CARE IF: Symptoms get worse or do not improve in 2 weeks, despite treatment. EXERCISES  RANGE OF MOTION (ROM) AND STRETCHING EXERCISES - Epicondylitis, Lateral (Tennis Elbow) These exercises may help you when beginning to rehabilitate your injury. Your symptoms may go away with or without further involvement from your physician, physical therapist, or athletic trainer. While completing these exercises, remember:   Restoring tissue flexibility helps normal motion to return to the joints. This allows healthier, less painful movement and activity.  An effective stretch should be held for at least 30 seconds.  A stretch should never be painful. You should only feel a gentle lengthening or release in the stretched tissue. RANGE OF MOTION - Wrist Flexion, Active-Assisted  Extend your right / left elbow with your fingers pointing down.*  Gently pull the back of your hand towards you, until you feel a gentle stretch on the top of your forearm.  Hold this position for __________ seconds. Repeat __________ times. Complete this exercise __________ times per day.  *If directed by your physician, physical therapist or athletic trainer, complete this stretch with your elbow bent, rather than extended. RANGE OF MOTION - Wrist Extension, Active-Assisted  Extend your right / left elbow and turn your palm upwards.*  Gently pull your palm and fingertips back, so your wrist extends and your fingers point more toward the ground.  You should feel a gentle stretch on the inside of your forearm.  Hold this position for __________ seconds. Repeat __________ times. Complete this exercise __________ times per day. *If directed by your physician, physical therapist or athletic trainer, complete this stretch with your elbow bent, rather than extended. STRETCH - Wrist Flexion  Place the back of your right / left hand on a tabletop, leaving your  elbow slightly bent. Your fingers should point away from your body.  Gently press the back of your hand down onto the table by straightening your elbow. You should feel a stretch on the top of your forearm.  Hold this position for __________ seconds. Repeat __________ times. Complete this stretch __________ times per day.  STRETCH - Wrist Extension   Place your right / left fingertips on a tabletop, leaving your elbow slightly bent. Your fingers should point backwards.  Gently press your fingers and palm down onto the table by straightening your elbow. You should feel a stretch on the inside of your forearm.  Hold this position for __________ seconds. Repeat __________ times. Complete this stretch __________ times per day.  STRENGTHENING EXERCISES - Epicondylitis, Lateral (Tennis Elbow) These exercises may help you when beginning to rehabilitate your injury. They may resolve your symptoms with or without further involvement from your physician, physical therapist, or athletic trainer. While completing these exercises, remember:   Muscles can gain both the endurance and the strength needed for everyday activities through controlled exercises.  Complete these exercises as instructed by your physician, physical therapist or athletic trainer. Increase the resistance and repetitions only as guided.  You may experience muscle soreness or fatigue, but the pain or discomfort you are trying to eliminate should never worsen during these exercises. If this pain does get worse, stop and make sure you are following the directions exactly. If the pain is still present after adjustments, discontinue the exercise until you can discuss the trouble with your caregiver. STRENGTH - Wrist Flexors  Sit with your right / left forearm palm-up and fully supported   on a table or countertop. Your elbow should be resting below the height of your shoulder. Allow your wrist to extend over the edge of the  surface.  Loosely holding a __________ weight, or a piece of rubber exercise band or tubing, slowly curl your hand up toward your forearm.  Hold this position for __________ seconds. Slowly lower the wrist back to the starting position in a controlled manner. Repeat __________ times. Complete this exercise __________ times per day.  STRENGTH - Wrist Extensors  Sit with your right / left forearm palm-down and fully supported on a table or countertop. Your elbow should be resting below the height of your shoulder. Allow your wrist to extend over the edge of the surface.  Loosely holding a __________ weight, or a piece of rubber exercise band or tubing, slowly curl your hand up toward your forearm.  Hold this position for __________ seconds. Slowly lower the wrist back to the starting position in a controlled manner. Repeat __________ times. Complete this exercise __________ times per day.  STRENGTH - Ulnar Deviators  Stand with a ____________________ weight in your right / left hand, or sit while holding a rubber exercise band or tubing, with your healthy arm supported on a table or countertop.  Move your wrist, so that your pinkie travels toward your forearm and your thumb moves away from your forearm.  Hold this position for __________ seconds and then slowly lower the wrist back to the starting position. Repeat __________ times. Complete this exercise __________ times per day STRENGTH - Radial Deviators  Stand with a ____________________ weight in your right / left hand, or sit while holding a rubber exercise band or tubing, with your injured arm supported on a table or countertop.  Raise your hand upward in front of you or pull up on the rubber tubing.  Hold this position for __________ seconds and then slowly lower the wrist back to the starting position. Repeat __________ times. Complete this exercise __________ times per day. STRENGTH - Forearm Supinators   Sit with your right /  left forearm supported on a table, keeping your elbow below shoulder height. Rest your hand over the edge, palm down.  Gently grip a hammer or a soup ladle.  Without moving your elbow, slowly turn your palm and hand upward to a "thumbs-up" position.  Hold this position for __________ seconds. Slowly return to the starting position. Repeat __________ times. Complete this exercise __________ times per day.  STRENGTH - Forearm Pronators   Sit with your right / left forearm supported on a table, keeping your elbow below shoulder height. Rest your hand over the edge, palm up.  Gently grip a hammer or a soup ladle.  Without moving your elbow, slowly turn your palm and hand upward to a "thumbs-up" position.  Hold this position for __________ seconds. Slowly return to the starting position. Repeat __________ times. Complete this exercise __________ times per day.  STRENGTH - Grip  Grasp a tennis ball, a dense sponge, or a large, rolled sock in your hand.  Squeeze as hard as you can, without increasing any pain.  Hold this position for __________ seconds. Release your grip slowly. Repeat __________ times. Complete this exercise __________ times per day.  STRENGTH - Elbow Extensors, Isometric  Stand or sit upright, on a firm surface. Place your right / left arm so that your palm faces your stomach, and it is at the height of your waist.  Place your opposite hand on the underside   of your forearm. Gently push up as your right / left arm resists. Push as hard as you can with both arms, without causing any pain or movement at your right / left elbow. Hold this stationary position for __________ seconds. Gradually release the tension in both arms. Allow your muscles to relax completely before repeating.   This information is not intended to replace advice given to you by your health care provider. Make sure you discuss any questions you have with your health care provider.   Document Released:  05/08/2005 Document Revised: 05/29/2014 Document Reviewed: 08/20/2008 Elsevier Interactive Patient Education 2016 [employer]  
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
## 2016-02-23 ENCOUNTER — Other Ambulatory Visit: Payer: Self-pay

## 2016-02-23 DIAGNOSIS — M25522 Pain in left elbow: Secondary | ICD-10-CM

## 2016-02-23 MED ORDER — DOXAZOSIN MESYLATE 1 MG PO TABS: 1.0000 mg | ORAL_TABLET | Freq: Every day | ORAL | 0 refills | Status: DC

## 2016-02-23 MED ORDER — MELOXICAM 15 MG PO TABS: 15.0000 mg | ORAL_TABLET | Freq: Every day | ORAL | 0 refills | Status: DC

## 2016-05-29 ENCOUNTER — Ambulatory Visit (INDEPENDENT_AMBULATORY_CARE_PROVIDER_SITE_OTHER): Payer: Medicare Other | Admitting: Pediatrics

## 2016-05-29 ENCOUNTER — Encounter: Payer: Self-pay | Admitting: Pediatrics

## 2016-05-29 VITALS — BP 139/82 | HR 76 | Temp 97.1°F | Ht 65.0 in | Wt 183.8 lb

## 2016-05-29 DIAGNOSIS — R3912 Poor urinary stream: Secondary | ICD-10-CM

## 2016-05-29 DIAGNOSIS — K5900 Constipation, unspecified: Secondary | ICD-10-CM

## 2016-05-29 DIAGNOSIS — I10 Essential (primary) hypertension: Secondary | ICD-10-CM

## 2016-05-29 DIAGNOSIS — N401 Enlarged prostate with lower urinary tract symptoms: Secondary | ICD-10-CM | POA: Diagnosis not present

## 2016-05-29 MED ORDER — DOXAZOSIN MESYLATE 2 MG PO TABS: 2.0000 mg | ORAL_TABLET | Freq: Every day | ORAL | 3 refills | Status: DC
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
## 2016-06-11 ENCOUNTER — Other Ambulatory Visit: Payer: Self-pay | Admitting: Pediatrics

## 2016-06-18 ENCOUNTER — Other Ambulatory Visit: Payer: Self-pay | Admitting: Pediatrics

## 2016-06-18 DIAGNOSIS — M25522 Pain in left elbow: Secondary | ICD-10-CM

## 2016-08-23 ENCOUNTER — Other Ambulatory Visit: Payer: Self-pay

## 2016-08-23 DIAGNOSIS — N401 Enlarged prostate with lower urinary tract symptoms: Secondary | ICD-10-CM

## 2016-08-23 DIAGNOSIS — R3912 Poor urinary stream: Principal | ICD-10-CM

## 2016-08-23 MED ORDER — DOXAZOSIN MESYLATE 2 MG PO TABS: 2.0000 mg | ORAL_TABLET | Freq: Every day | ORAL | 0 refills | Status: DC

## 2016-09-14 ENCOUNTER — Other Ambulatory Visit: Payer: Self-pay | Admitting: Pediatrics

## 2016-09-15 ENCOUNTER — Encounter: Payer: Self-pay | Admitting: Family Medicine

## 2016-09-20 ENCOUNTER — Ambulatory Visit (INDEPENDENT_AMBULATORY_CARE_PROVIDER_SITE_OTHER): Payer: Medicare Other | Admitting: Family Medicine

## 2016-09-20 ENCOUNTER — Encounter: Payer: Self-pay | Admitting: Family Medicine

## 2016-09-20 VITALS — BP 119/70 | HR 75 | Temp 97.7°F | Ht 65.0 in | Wt 181.0 lb

## 2016-09-20 DIAGNOSIS — J329 Chronic sinusitis, unspecified: Secondary | ICD-10-CM

## 2016-09-20 DIAGNOSIS — J40 Bronchitis, not specified as acute or chronic: Secondary | ICD-10-CM | POA: Diagnosis not present

## 2016-09-20 MED ORDER — AMOXICILLIN-POT CLAVULANATE 875-125 MG PO TABS: 1.0000 | ORAL_TABLET | Freq: Two times a day (BID) | ORAL | 0 refills | Status: DC

## 2016-09-20 MED ORDER — PSEUDOEPHEDRINE-GUAIFENESIN ER 120-1200 MG PO TB12: 1.0000 | ORAL_TABLET | Freq: Two times a day (BID) | ORAL | 0 refills | Status: DC

## 2016-09-20 NOTE — Progress Notes (Signed)
Subjective:  Patient ID: Todd Snow, male    DOB: 10/03/1945  Age: 71 y.o. MRN: [id number]  CC: Cough (pt here today c/o cough and congestion )   HPI Todd Snow presents for Patient presents with upper respiratory congestion. Rhinorrhea that is frequently purulent. There is moderate sore throat. Patient reports coughing frequently as well.  yellow sputum noted. There is no fever, chills or sweats. The patient denies being short of breath. Onset was 3-5 days ago. Gradually worsening. Tried OTCs without improvement.   History Todd Snow has a past medical history of GERD (gastroesophageal reflux disease); Gout; and Hypertension.   Todd Snow has no past surgical history on file.   His family history includes COPD in his father; Cancer in his sister.Todd Snow reports that Todd Snow quit smoking about 16 years ago. His smoking use included Cigarettes. Todd Snow started smoking about 46 years ago. Todd Snow smoked 20.00 packs per day. Todd Snow has never used smokeless tobacco. Todd Snow reports that Todd Snow drinks about 6.0 oz of alcohol per week . Todd Snow reports that Todd Snow does not use drugs.  Current Outpatient Prescriptions on File Prior to Visit  Medication Sig Dispense Refill  . diazepam (VALIUM) 10 MG tablet Take 1 tablet by mouth 2 (two) times daily as needed. For anxiety  2  . doxazosin (CARDURA) 2 MG tablet Take 1 tablet (2 mg total) by mouth daily. 90 tablet 0  . meloxicam (MOBIC) 15 MG tablet TAKE 1 TABLET (15 MG TOTAL) BY MOUTH DAILY. 90 tablet 0  . nystatin cream (MYCOSTATIN) Use bid for groin as needed.    . Oxycodone HCl 10 MG TABS TAKE 1 TO 2 TABLETS BY MOUTH 4 TIMES A DAY  0  . triamcinolone cream (KENALOG) 0.1 % Apply twice daily     No current facility-administered medications on file prior to visit.     ROS Review of Systems  Constitutional: Negative for activity change, appetite change, chills and fever.  HENT: Positive for congestion, postnasal drip, rhinorrhea and sinus pressure. Negative for ear discharge, ear pain, hearing  loss, nosebleeds, sneezing and trouble swallowing.   Respiratory: Negative for chest tightness and shortness of breath.   Cardiovascular: Negative for chest pain and palpitations.  Skin: Negative for rash.    Objective:  BP 119/70   Pulse 75   Temp 97.7 F (36.5 C) (Oral)   Ht  (1.651 m)   Wt 181 lb (82.1 kg)   BMI 30.12 kg/m   Physical Exam  Constitutional: Todd Snow appears well-developed and well-nourished.  HENT:  Head: Normocephalic and atraumatic.  Right Ear: Tympanic membrane and external ear normal. No decreased hearing is noted.  Left Ear: Tympanic membrane and external ear normal. No decreased hearing is noted.  Nose: Mucosal edema present. Right sinus exhibits no frontal sinus tenderness. Left sinus exhibits no frontal sinus tenderness.  Mouth/Throat: No oropharyngeal exudate or posterior oropharyngeal erythema.  Neck: No Brudzinski's sign noted.  Pulmonary/Chest: Breath sounds normal. No respiratory distress.  Lymphadenopathy:       Head (right side): No preauricular adenopathy present.       Head (left side): No preauricular adenopathy present.       Right cervical: No superficial cervical adenopathy present.      Left cervical: No superficial cervical adenopathy present.    Assessment & Plan:   Todd Snow was seen today for cough.  Diagnoses and all orders for this visit:  Sinobronchitis  Other orders -     amoxicillin-clavulanate (AUGMENTIN) 875-125 MG tablet;  Take 1 tablet by mouth 2 (two) times daily. Take all of this medication -     Pseudoephedrine-Guaifenesin [phone] MG TB12; Take 1 tablet by mouth 2 (two) times daily. For congestion   I have discontinued Mr. Todd Snow aspirin, omeprazole, sildenafil, and psyllium. I am also having him start on amoxicillin-clavulanate and Pseudoephedrine-Guaifenesin. Additionally, I am having him maintain his nystatin cream, triamcinolone cream, diazepam, Oxycodone HCl, meloxicam, and doxazosin.  Meds ordered this encounter   Medications  . amoxicillin-clavulanate (AUGMENTIN) 875-125 MG tablet    Sig: Take 1 tablet by mouth 2 (two) times daily. Take all of this medication    Dispense:  20 tablet    Refill:  0  . Pseudoephedrine-Guaifenesin [phone] MG TB12    Sig: Take 1 tablet by mouth 2 (two) times daily. For congestion    Dispense:  20 each    Refill:  0     Follow-up: Return if symptoms worsen or fail to improve.  Mechele Claude, M.D.

## 2016-09-27 ENCOUNTER — Other Ambulatory Visit: Payer: Self-pay

## 2016-09-27 DIAGNOSIS — N401 Enlarged prostate with lower urinary tract symptoms: Secondary | ICD-10-CM

## 2016-09-27 DIAGNOSIS — R3912 Poor urinary stream: Principal | ICD-10-CM

## 2016-09-27 MED ORDER — DOXAZOSIN MESYLATE 2 MG PO TABS: 2.0000 mg | ORAL_TABLET | Freq: Every day | ORAL | 0 refills | Status: DC

## 2016-10-11 ENCOUNTER — Other Ambulatory Visit: Payer: Self-pay | Admitting: Pediatrics

## 2016-10-11 DIAGNOSIS — N401 Enlarged prostate with lower urinary tract symptoms: Secondary | ICD-10-CM

## 2016-10-11 DIAGNOSIS — R3912 Poor urinary stream: Principal | ICD-10-CM

## 2016-11-09 ENCOUNTER — Other Ambulatory Visit: Payer: Self-pay | Admitting: *Deleted

## 2016-11-09 DIAGNOSIS — M25522 Pain in left elbow: Secondary | ICD-10-CM

## 2016-11-09 MED ORDER — MELOXICAM 15 MG PO TABS: 15.0000 mg | ORAL_TABLET | Freq: Every day | ORAL | 0 refills | Status: DC

## 2016-11-27 ENCOUNTER — Ambulatory Visit (INDEPENDENT_AMBULATORY_CARE_PROVIDER_SITE_OTHER): Payer: Medicare Other | Admitting: Pediatrics

## 2016-11-27 ENCOUNTER — Encounter: Payer: Self-pay | Admitting: Pediatrics

## 2016-11-27 VITALS — BP 120/74 | HR 81 | Temp 97.8°F | Ht 65.0 in | Wt 186.8 lb

## 2016-11-27 DIAGNOSIS — R739 Hyperglycemia, unspecified: Secondary | ICD-10-CM

## 2016-11-27 DIAGNOSIS — N400 Enlarged prostate without lower urinary tract symptoms: Secondary | ICD-10-CM | POA: Diagnosis not present

## 2016-11-27 DIAGNOSIS — Z125 Encounter for screening for malignant neoplasm of prostate: Secondary | ICD-10-CM

## 2016-11-27 DIAGNOSIS — G2581 Restless legs syndrome: Secondary | ICD-10-CM

## 2016-11-27 LAB — BAYER DCA HB A1C WAIVED: HB A1C (BAYER DCA - WAIVED): 5 % (ref <7.0)

## 2016-11-27 MED ORDER — PRAMIPEXOLE DIHYDROCHLORIDE 0.125 MG PO TABS: ORAL_TABLET | ORAL | 1 refills | Status: DC

## 2016-11-27 NOTE — Progress Notes (Signed)
  Subjective:   Patient ID: Todd Snow, male    DOB: 1945-07-12, 71 y.o.   MRN: [id number] CC: Follow-up (6 month) multiple med problems HPI: Todd Snow is a 71 y.o. male presenting for Follow-up (6 month) here today with girlfriend  Has been able to do the activities he wants, is not limited Taking care of a large garden, growing tomatoes, cucumbers  Constipation: on chronic narcotics, follows with pain management Having stools several days a week, not always daily  UTD colonoscopy, just had one, says he had one small polyp  Most nights a week feels like he has to move his arms/ legs, has to stand up Keeps him from falling asleep Taking some naps during the day Minimal snoring, no pauses with breathing  No CP, no SOB Normal urination  Relevant past medical, surgical, family and social history reviewed. Allergies and medications reviewed and updated. History  Smoking Status  . Former Smoker  . Packs/day: 20.00  . Types: Cigarettes  . Start date: 12/20/1969  . Quit date: 12/21/1999  Smokeless Tobacco  . Never Used   ROS: Per HPI   Objective:    BP 120/74   Pulse 81   Temp 97.8 F (36.6 C) (Oral)   Ht '5\' 5"'$  (1.651 m)   Wt 186 lb 12.8 oz (84.7 kg)   BMI 31.09 kg/m   Wt Readings from Last 3 Encounters:  11/27/16 186 lb 12.8 oz (84.7 kg)  09/20/16 181 lb (82.1 kg)  05/29/16 183 lb 12.8 oz (83.4 kg)    Gen: NAD, alert, cooperative with exam, NCAT EYES: EOMI, no conjunctival injection, or no icterus ENT:  TMs pearly gray b/l, OP without erythema LYMPH: no cervical LAD CV: NRRR, normal S1/S2, no murmur, distal pulses 2+ b/l Resp: CTABL, no wheezes, normal WOB Abd: +BS, soft, NTND. no guarding or organomegaly Ext: No edema, warm Neuro: Alert and oriented, strength equal b/l UE and LE, coordination grossly normal MSK: normal muscle bulk  Assessment & Plan:  Todd Snow was seen today for follow-up multiple med problems.  Diagnoses and all orders for this  visit:  Restless leg New problem Check labs, trial of below -     Anemia Profile B -     CMP14+EGFR -     pramipexole (MIRAPEX) 0.125 MG tablet; One tab 2 hrs before before for 2 weeks, then increase to 2 tabs 2 hrs before bed  Prostate cancer screening BPH On doxazosin, no symptoms -     PSA, total and free  Hyperglycemia -     Bayer DCA Hb A1c Waived   Follow up plan: Return in about 4 weeks (around 12/25/2016). Assunta Found, MD [place]

## 2016-11-28 LAB — ANEMIA PROFILE B
BASOS ABS: 0.1 10*3/uL (ref 0.0–0.2)
Basos: 2 %
EOS (ABSOLUTE): 0.3 10*3/uL (ref 0.0–0.4)
Eos: 5 %
Ferritin: 65 ng/mL (ref 30–400)
Folate: >20 ng/mL (ref >3.0)
Hematocrit: 42 % (ref 37.5–51.0)
Hemoglobin: 14.2 g/dL (ref 13.0–17.7)
IMMATURE GRANS (ABS): 0 10*3/uL (ref 0.0–0.1)
IMMATURE GRANULOCYTES: 0 %
Iron Saturation: 22 % (ref 15–55)
Iron: 60 ug/dL (ref 38–169)
LYMPHS: 27 %
Lymphocytes Absolute: 1.5 10*3/uL (ref 0.7–3.1)
MCH: 33.6 pg — ABNORMAL HIGH (ref 26.6–33.0)
MCHC: 33.8 g/dL (ref 31.5–35.7)
MCV: 100 fL — ABNORMAL HIGH (ref 79–97)
MONOS ABS: 0.6 10*3/uL (ref 0.1–0.9)
Monocytes: 10 %
NEUTROS PCT: 56 %
Neutrophils Absolute: 3.3 10*3/uL (ref 1.4–7.0)
Platelets: 263 10*3/uL (ref 150–379)
RBC: 4.22 x10E6/uL (ref 4.14–5.80)
RDW: 13.4 % (ref 12.3–15.4)
Retic Ct Pct: 1.3 % (ref 0.6–2.6)
Total Iron Binding Capacity: 276 ug/dL (ref 250–450)
UIBC: 216 ug/dL (ref 111–343)
Vitamin B-12: 786 pg/mL (ref 232–1245)
WBC: 5.8 10*3/uL (ref 3.4–10.8)

## 2016-11-28 LAB — CMP14+EGFR
ALBUMIN: 3.9 g/dL (ref 3.5–4.8)
ALK PHOS: 85 IU/L (ref 39–117)
ALT: 17 IU/L (ref 0–44)
AST: 21 IU/L (ref 0–40)
Albumin/Globulin Ratio: 1.6 (ref 1.2–2.2)
BUN / CREAT RATIO: 12 (ref 10–24)
BUN: 10 mg/dL (ref 8–27)
Bilirubin Total: 0.4 mg/dL (ref 0.0–1.2)
CALCIUM: 8.7 mg/dL (ref 8.6–10.2)
CO2: 23 mmol/L (ref 20–29)
CREATININE: 0.82 mg/dL (ref 0.76–1.27)
Chloride: 106 mmol/L (ref 96–106)
GFR, EST AFRICAN AMERICAN: 101 mL/min/{1.73_m2} (ref >59)
GFR, EST NON AFRICAN AMERICAN: 88 mL/min/{1.73_m2} (ref >59)
GLOBULIN, TOTAL: 2.5 g/dL (ref 1.5–4.5)
Glucose: 88 mg/dL (ref 65–99)
Potassium: 4.3 mmol/L (ref 3.5–5.2)
SODIUM: 141 mmol/L (ref 134–144)
TOTAL PROTEIN: 6.4 g/dL (ref 6.0–8.5)

## 2016-11-28 LAB — PSA, TOTAL AND FREE
PSA, Free Pct: 17.9 %
PSA, Free: 0.5 ng/mL
Prostate Specific Ag, Serum: 2.8 ng/mL (ref 0.0–4.0)

## 2016-12-21 ENCOUNTER — Ambulatory Visit (INDEPENDENT_AMBULATORY_CARE_PROVIDER_SITE_OTHER): Payer: Medicare Other | Admitting: Pediatrics

## 2016-12-21 ENCOUNTER — Encounter: Payer: Self-pay | Admitting: Pediatrics

## 2016-12-21 VITALS — BP 113/72 | HR 79 | Temp 97.7°F | Ht 65.0 in | Wt 180.8 lb

## 2016-12-21 DIAGNOSIS — F419 Anxiety disorder, unspecified: Secondary | ICD-10-CM

## 2016-12-21 DIAGNOSIS — N401 Enlarged prostate with lower urinary tract symptoms: Secondary | ICD-10-CM | POA: Diagnosis not present

## 2016-12-21 DIAGNOSIS — G2581 Restless legs syndrome: Secondary | ICD-10-CM | POA: Diagnosis not present

## 2016-12-21 DIAGNOSIS — R3912 Poor urinary stream: Secondary | ICD-10-CM | POA: Diagnosis not present

## 2016-12-21 MED ORDER — DOXAZOSIN MESYLATE 2 MG PO TABS: 2.0000 mg | ORAL_TABLET | Freq: Every day | ORAL | 1 refills | Status: DC

## 2016-12-21 MED ORDER — ESCITALOPRAM OXALATE 10 MG PO TABS: 10.0000 mg | ORAL_TABLET | Freq: Every day | ORAL | 1 refills | Status: DC

## 2016-12-21 MED ORDER — PRAMIPEXOLE DIHYDROCHLORIDE 0.125 MG PO TABS: ORAL_TABLET | ORAL | 1 refills | Status: DC
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
## 2016-12-28 ENCOUNTER — Other Ambulatory Visit: Payer: Self-pay | Admitting: Pediatrics

## 2016-12-28 DIAGNOSIS — M25522 Pain in left elbow: Secondary | ICD-10-CM

## 2017-05-16 ENCOUNTER — Other Ambulatory Visit: Payer: Self-pay | Admitting: Pediatrics

## 2017-05-16 DIAGNOSIS — G2581 Restless legs syndrome: Secondary | ICD-10-CM

## 2017-05-16 DIAGNOSIS — F419 Anxiety disorder, unspecified: Secondary | ICD-10-CM

## 2017-05-16 DIAGNOSIS — R3912 Poor urinary stream: Secondary | ICD-10-CM

## 2017-05-16 DIAGNOSIS — M25522 Pain in left elbow: Secondary | ICD-10-CM

## 2017-05-16 DIAGNOSIS — N401 Enlarged prostate with lower urinary tract symptoms: Secondary | ICD-10-CM

## 2017-05-22 HISTORY — PX: UPPER GI ENDOSCOPY: SHX6162

## 2017-05-22 HISTORY — PX: COLONOSCOPY W/ POLYPECTOMY: SHX1380

## 2017-06-25 ENCOUNTER — Encounter: Payer: Self-pay | Admitting: Pediatrics

## 2017-06-25 ENCOUNTER — Ambulatory Visit (INDEPENDENT_AMBULATORY_CARE_PROVIDER_SITE_OTHER): Payer: Medicare Other | Admitting: Pediatrics

## 2017-06-25 VITALS — BP 126/74 | HR 70 | Temp 97.3°F | Ht 65.0 in | Wt 179.2 lb

## 2017-06-25 DIAGNOSIS — H9313 Tinnitus, bilateral: Secondary | ICD-10-CM | POA: Diagnosis not present

## 2017-06-25 DIAGNOSIS — H6982 Other specified disorders of Eustachian tube, left ear: Secondary | ICD-10-CM | POA: Diagnosis not present

## 2017-06-25 DIAGNOSIS — R413 Other amnesia: Secondary | ICD-10-CM | POA: Diagnosis not present

## 2017-06-25 DIAGNOSIS — G2581 Restless legs syndrome: Secondary | ICD-10-CM

## 2017-06-25 DIAGNOSIS — F419 Anxiety disorder, unspecified: Secondary | ICD-10-CM | POA: Diagnosis not present

## 2017-06-25 DIAGNOSIS — L989 Disorder of the skin and subcutaneous tissue, unspecified: Secondary | ICD-10-CM

## 2017-06-25 MED ORDER — FLUTICASONE PROPIONATE 50 MCG/ACT NA SUSP: 2.0000 | Freq: Every day | NASAL | 6 refills | Status: AC
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
## 2017-07-06 DIAGNOSIS — H6121 Impacted cerumen, right ear: Secondary | ICD-10-CM | POA: Insufficient documentation

## 2017-07-06 DIAGNOSIS — H9313 Tinnitus, bilateral: Secondary | ICD-10-CM | POA: Insufficient documentation

## 2017-07-06 DIAGNOSIS — H903 Sensorineural hearing loss, bilateral: Secondary | ICD-10-CM | POA: Insufficient documentation

## 2017-09-06 ENCOUNTER — Telehealth: Payer: Self-pay | Admitting: Pediatrics

## 2017-09-06 NOTE — Telephone Encounter (Signed)
Office note for 12/21/2016 and 06/25/17 / labs for 11/27/16. Fax to [phone] attn dr Jules Husbandsdyer..Marland Kitchen

## 2017-12-24 ENCOUNTER — Encounter: Payer: Self-pay | Admitting: Pediatrics

## 2017-12-24 ENCOUNTER — Ambulatory Visit (INDEPENDENT_AMBULATORY_CARE_PROVIDER_SITE_OTHER): Payer: Medicare Other | Admitting: Pediatrics

## 2017-12-24 ENCOUNTER — Ambulatory Visit (INDEPENDENT_AMBULATORY_CARE_PROVIDER_SITE_OTHER): Payer: Medicare Other

## 2017-12-24 VITALS — BP 107/65 | HR 76 | Temp 97.2°F | Ht 65.0 in | Wt 174.4 lb

## 2017-12-24 DIAGNOSIS — G2581 Restless legs syndrome: Secondary | ICD-10-CM

## 2017-12-24 DIAGNOSIS — Z125 Encounter for screening for malignant neoplasm of prostate: Secondary | ICD-10-CM

## 2017-12-24 DIAGNOSIS — R1010 Upper abdominal pain, unspecified: Secondary | ICD-10-CM

## 2017-12-24 DIAGNOSIS — R6881 Early satiety: Secondary | ICD-10-CM | POA: Diagnosis not present

## 2017-12-24 MED ORDER — PRAMIPEXOLE DIHYDROCHLORIDE 0.125 MG PO TABS: ORAL_TABLET | ORAL | 3 refills | Status: DC
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
## 2017-12-25 LAB — CBC WITH DIFFERENTIAL/PLATELET
BASOS: 1 %
Basophils Absolute: 0.1 10*3/uL (ref 0.0–0.2)
EOS (ABSOLUTE): 0.3 10*3/uL (ref 0.0–0.4)
Eos: 4 %
Hematocrit: 47.5 % (ref 37.5–51.0)
Hemoglobin: 16.5 g/dL (ref 13.0–17.7)
IMMATURE GRANULOCYTES: 0 %
Immature Grans (Abs): 0 10*3/uL (ref 0.0–0.1)
LYMPHS: 24 %
Lymphocytes Absolute: 1.9 10*3/uL (ref 0.7–3.1)
MCH: 35 pg — ABNORMAL HIGH (ref 26.6–33.0)
MCHC: 34.7 g/dL (ref 31.5–35.7)
MCV: 101 fL — AB (ref 79–97)
Monocytes Absolute: 0.8 10*3/uL (ref 0.1–0.9)
Monocytes: 10 %
NEUTROS PCT: 61 %
Neutrophils Absolute: 4.7 10*3/uL (ref 1.4–7.0)
PLATELETS: 243 10*3/uL (ref 150–450)
RBC: 4.72 x10E6/uL (ref 4.14–5.80)
RDW: 13.7 % (ref 12.3–15.4)
WBC: 7.7 10*3/uL (ref 3.4–10.8)

## 2017-12-25 LAB — PSA, TOTAL AND FREE
PSA FREE PCT: 29 %
PSA FREE: 0.29 ng/mL
Prostate Specific Ag, Serum: 1 ng/mL (ref 0.0–4.0)

## 2017-12-25 LAB — CMP14+EGFR
A/G RATIO: 1.7 (ref 1.2–2.2)
ALT: 15 IU/L (ref 0–44)
AST: 16 IU/L (ref 0–40)
Albumin: 4 g/dL (ref 3.5–4.8)
Alkaline Phosphatase: 115 IU/L (ref 39–117)
BUN/Creatinine Ratio: 6 — ABNORMAL LOW (ref 10–24)
BUN: 5 mg/dL — AB (ref 8–27)
Bilirubin Total: 0.5 mg/dL (ref 0.0–1.2)
CO2: 26 mmol/L (ref 20–29)
CREATININE: 0.82 mg/dL (ref 0.76–1.27)
Calcium: 8.6 mg/dL (ref 8.6–10.2)
Chloride: 100 mmol/L (ref 96–106)
GFR, EST AFRICAN AMERICAN: 101 mL/min/{1.73_m2} (ref >59)
GFR, EST NON AFRICAN AMERICAN: 87 mL/min/{1.73_m2} (ref >59)
GLUCOSE: 89 mg/dL (ref 65–99)
Globulin, Total: 2.4 g/dL (ref 1.5–4.5)
Potassium: 3.9 mmol/L (ref 3.5–5.2)
Sodium: 140 mmol/L (ref 134–144)
TOTAL PROTEIN: 6.4 g/dL (ref 6.0–8.5)

## 2018-01-03 ENCOUNTER — Ambulatory Visit (INDEPENDENT_AMBULATORY_CARE_PROVIDER_SITE_OTHER): Payer: Medicare Other

## 2018-01-03 DIAGNOSIS — R6881 Early satiety: Secondary | ICD-10-CM

## 2018-01-03 DIAGNOSIS — I700 Atherosclerosis of aorta: Secondary | ICD-10-CM | POA: Diagnosis not present

## 2018-01-03 DIAGNOSIS — K56699 Other intestinal obstruction unspecified as to partial versus complete obstruction: Secondary | ICD-10-CM | POA: Diagnosis not present

## 2018-01-08 ENCOUNTER — Telehealth: Payer: Self-pay | Admitting: *Deleted

## 2018-01-08 NOTE — Telephone Encounter (Signed)
[facility] [employer] [employer] [facility] Pts BP one arm was 80/50 other was 82/60 Pt asymptomatic, decrease appetite, c/o right sided abd pain, drinks 40 oz of Rum week, starts in morning, she encouraged pt to wait till later in the day that this may help with his appetitie

## 2018-01-09 ENCOUNTER — Other Ambulatory Visit: Payer: Self-pay | Admitting: Pediatrics

## 2018-01-09 ENCOUNTER — Telehealth: Payer: Self-pay | Admitting: Pediatrics

## 2018-01-09 DIAGNOSIS — R140 Abdominal distension (gaseous): Secondary | ICD-10-CM

## 2018-01-09 DIAGNOSIS — R109 Unspecified abdominal pain: Secondary | ICD-10-CM

## 2018-01-09 NOTE — Telephone Encounter (Signed)
Called and d/w pt, referral in to GI

## 2018-01-10 ENCOUNTER — Ambulatory Visit (HOSPITAL_COMMUNITY): Payer: Medicare Other

## 2018-03-27 ENCOUNTER — Encounter: Payer: Self-pay | Admitting: Pediatrics

## 2018-03-27 ENCOUNTER — Ambulatory Visit (INDEPENDENT_AMBULATORY_CARE_PROVIDER_SITE_OTHER): Payer: Medicare Other | Admitting: Pediatrics

## 2018-03-27 VITALS — BP 117/81 | HR 67 | Temp 97.1°F | Ht 65.0 in | Wt 172.2 lb

## 2018-03-27 DIAGNOSIS — G4700 Insomnia, unspecified: Secondary | ICD-10-CM

## 2018-03-27 DIAGNOSIS — G2581 Restless legs syndrome: Secondary | ICD-10-CM | POA: Diagnosis not present

## 2018-03-27 DIAGNOSIS — R109 Unspecified abdominal pain: Secondary | ICD-10-CM | POA: Diagnosis not present

## 2018-03-27 NOTE — Progress Notes (Signed)
  Subjective:   Patient ID: Todd Snow, male    DOB: 14-Mar-1946, 72 y.o.   MRN: [id number] CC: Medical Management of Chronic Issues  HPI: Todd Snow is a 73 y.o. male   Since last visit was seen by GI, had CT scan showing narrowing of sigmoid then colonoscopy with GI. No significant findings per pt, not yet received colonoscopy report. Appetite has remained about the same.  Insomnia: gets up at different time sin morning, often takes 2 hr nap after dinner. Often sleeps only 4-5 hours at a time, gets up and watches tv or uses computer for a couple hours then goes back to bed. He isnt bothered by schedule much though his partner owuld like him to keep a schedule similar to hers.  RLS: symptoms not bothering him. Stopped medicine  Walking regularly, no chest pain or SOB with exertion.   Relevant past medical, surgical, family and social history reviewed. Allergies and medications reviewed and updated. Social History   Tobacco Use  Smoking Status Former Smoker  . Packs/day: 20.00  . Types: Cigarettes  . Start date: 12/20/1969  . Last attempt to quit: 12/21/1999  . Years since quitting: 18.2  Smokeless Tobacco Never Used   ROS: Per HPI   Objective:    BP 117/81   Pulse 67   Temp (!) 97.1 F (36.2 C) (Oral)   Ht 5\' 5"  (1.651 m)   Wt 172 lb 3.2 oz (78.1 kg)   BMI 28.66 kg/m   Wt Readings from Last 3 Encounters:  03/27/18 172 lb 3.2 oz (78.1 kg)  12/24/17 174 lb 6.4 oz (79.1 kg)  06/25/17 179 lb 3.2 oz (81.3 kg)    Gen: NAD, alert, cooperative with exam, NCAT EYES: EOMI, no conjunctival injection, or no icterus ENT:   OP without erythema LYMPH: no cervical LAD CV: NRRR, normal S1/S2, no murmur, distal pulses 2+ b/l Resp: CTABL, no wheezes, normal WOB Abd: +BS, soft, NTND. no guarding or organomegaly Ext: No edema, warm Neuro: Alert and oriented, strength equal b/l UE and LE, coordination grossly normal MSK: normal muscle bulk  Assessment & Plan:  Todd Snow was seen today  for medical management of chronic issues.  Diagnoses and all orders for this visit:  Insomnia, unspecified type Discussed sleep hygiene.  Abdominal pain, unspecified abdominal location Improving, has had f/u with GI and colonoscopy  Restless leg Improved, off of medicine now  Follow up plan: Return in about 6 months (around 09/25/2018). Jaylynn Kras, MD [place] [facility]

## 2018-10-10 ENCOUNTER — Ambulatory Visit (INDEPENDENT_AMBULATORY_CARE_PROVIDER_SITE_OTHER): Payer: Medicare Other | Admitting: *Deleted

## 2018-10-10 ENCOUNTER — Other Ambulatory Visit: Payer: Self-pay

## 2018-10-10 DIAGNOSIS — Z0000 Encounter for general adult medical examination without abnormal findings: Secondary | ICD-10-CM | POA: Diagnosis not present

## 2018-10-10 NOTE — Patient Instructions (Signed)
Preventive Care 2 Years and Older, Male Preventive care refers to lifestyle choices and visits with your health care provider that can promote health and wellness. What does preventive care include?   A yearly physical exam. This is also called an annual well check.  Dental exams once or twice a year.  Routine eye exams. Ask your health care provider how often you should have your eyes checked.  Personal lifestyle choices, including: ? Daily care of your teeth and gums. ? Regular physical activity. ? Eating a healthy diet. ? Avoiding tobacco and drug use. ? Limiting alcohol use. ? Practicing safe sex. ? Taking low doses of aspirin every day. ? Taking vitamin and mineral supplements as recommended by your health care provider. What happens during an annual well check? The services and screenings done by your health care provider during your annual well check will depend on your age, overall health, lifestyle risk factors, and family history of disease. Counseling Your health care provider may ask you questions about your:  Alcohol use.  Tobacco use.  Drug use.  Emotional well-being.  Home and relationship well-being.  Sexual activity.  Eating habits.  History of falls.  Memory and ability to understand (cognition).  Work and work Statistician. Screening You may have the following tests or measurements:  Height, weight, and BMI.  Blood pressure.  Lipid and cholesterol levels. These may be checked every 5 years, or more frequently if you are over 9 years old.  Skin check.  Lung cancer screening. You may have this screening every year starting at age 57 if you have a 30-pack-year history of smoking and currently smoke or have quit within the past 15 years.  Colorectal cancer screening. All adults should have this screening starting at age 90 and continuing until age 69. You will have tests every 1-10 years, depending on your results and the type of screening  test. People at increased risk should start screening at an earlier age. Screening tests may include: ? Guaiac-based fecal occult blood testing. ? Fecal immunochemical test (FIT). ? Stool DNA test. ? Virtual colonoscopy. ? Sigmoidoscopy. During this test, a flexible tube with a tiny camera (sigmoidoscope) is used to examine your rectum and lower colon. The sigmoidoscope is inserted through your anus into your rectum and lower colon. ? Colonoscopy. During this test, a long, thin, flexible tube with a tiny camera (colonoscope) is used to examine your entire colon and rectum.  Prostate cancer screening. Recommendations will vary depending on your family history and other risks.  Hepatitis C blood test.  Hepatitis B blood test.  Sexually transmitted disease (STD) testing.  Diabetes screening. This is done by checking your blood sugar (glucose) after you have not eaten for a while (fasting). You may have this done every 1-3 years.  Abdominal aortic aneurysm (AAA) screening. You may need this if you are a current or former smoker.  Osteoporosis. You may be screened starting at age 30 if you are at high risk. Talk with your health care provider about your test results, treatment options, and if necessary, the need for more tests. Vaccines Your health care provider may recommend certain vaccines, such as:  Influenza vaccine. This is recommended every year.  Tetanus, diphtheria, and acellular pertussis (Tdap, Td) vaccine. You may need a Td booster every 10 years.  Varicella vaccine. You may need this if you have not been vaccinated.  Zoster vaccine. You may need this after age 42.  Measles, mumps, and rubella (MMR) vaccine.  You may need at least one dose of MMR if you were born in 1957 or later. You may also need a second dose.  Pneumococcal 13-valent conjugate (PCV13) vaccine. One dose is recommended after age 65.  Pneumococcal polysaccharide (PPSV23) vaccine. One dose is recommended  after age 65.  Meningococcal vaccine. You may need this if you have certain conditions.  Hepatitis A vaccine. You may need this if you have certain conditions or if you travel or work in places where you may be exposed to hepatitis A.  Hepatitis B vaccine. You may need this if you have certain conditions or if you travel or work in places where you may be exposed to hepatitis B.  Haemophilus influenzae type b (Hib) vaccine. You may need this if you have certain risk factors. Talk to your health care provider about which screenings and vaccines you need and how often you need them. This information is not intended to replace advice given to you by your health care provider. Make sure you discuss any questions you have with your health care provider. Document Released: 06/04/2015 Document Revised: 06/28/2017 Document Reviewed: 03/09/2015 Elsevier Interactive Patient Education  2019 Elsevier Inc.  

## 2018-10-10 NOTE — Progress Notes (Signed)
MEDICARE ANNUAL WELLNESS VISIT  10/10/2018  Telephone Visit Disclaimer This Medicare AWV was conducted by telephone due to national recommendations for restrictions regarding the COVID-19 Pandemic (e.g. social distancing).  I verified, using two identifiers, that I am speaking with Todd Snow or their authorized healthcare agent. I discussed the limitations, risks, security, and privacy concerns of performing an evaluation and management service by telephone and the potential availability of an in-person appointment in the future. The patient expressed understanding and agreed to proceed.   Subjective:  Todd Snow is a 73 y.o. male patient of Stacks, Broadus JohnWarren, MD who had a Medicare Annual Wellness Visit today via telephone. Todd Snow is Disabled and lives with their girlfriend. he has 2 children. he reports that he is socially active and does interact with friends/family regularly. he is minimally physically active and enjoys target practice with his guns.  Patient Care Team: Mechele ClaudeStacks, Warren, MD as PCP - General (Family Medicine)  Advanced Directives 10/10/2018  Does Patient Have a Medical Advance Directive? No  Would patient like information on creating a medical advance directive? No - Patient declined    Hospital Utilization Over the Past 12 Months: # of hospitalizations or ER visits: 0 # of surgeries: 0  Review of Systems    Patient reports that his overall health is unchanged compared to last year.  Patient Reported Readings (BP, Pulse, CBG, Weight, etc) none  Review of Systems: Negative except his chronic back pain which he has had since he was 73 years old  All other systems negative.  Pain Assessment Pain : 0-10 Pain Score: 7  Pain Type: Chronic pain Pain Location: Back Pain Onset: Other (comment)(has had this pain since he was 73 years old) Pain Frequency: Constant Pain Relieving Factors: taking his pain medications Effect of Pain on Daily Activities: can do his  daily acitivities as long he takes his pain medications  Pain Relieving Factors: taking his pain medications  Current Medications & Allergies (verified) Allergies as of 10/10/2018      Reactions   Morphine And Related Itching, Swelling   Throat Swelling   Meperidine Nausea And Vomiting   Penicillins Nausea And Vomiting, Rash   Tamsulosin Itching   Vision blurred   Flomax [tamsulosin Hcl] Other (See Comments)   Vision blurred      Medication List       Accurate as of Oct 10, 2018  4:03 PM. If you have any questions, ask your nurse or doctor.        STOP taking these medications   Fluzone High-Dose 0.5 ML injection Generic drug:  Influenza vac split quadrivalent PF     TAKE these medications   ASCORBIC ACID PO Take 1,000 mg by mouth daily.   diazepam 10 MG tablet Commonly known as:  VALIUM Take 1 tablet by mouth 2 (two) times daily as needed. For anxiety   fluticasone 50 MCG/ACT nasal spray Commonly known as:  FLONASE Place 2 sprays into both nostrils daily.   multivitamin tablet Take 1 tablet by mouth daily.   Oxycodone HCl 10 MG Tabs TAKE 1 TO 2 TABLETS BY MOUTH 4 TIMES A DAY   pantoprazole 40 MG tablet Commonly known as:  PROTONIX Take 40 mg by mouth daily.   triamcinolone cream 0.1 % Commonly known as:  KENALOG Apply twice daily   VITAMIN D3 COMPLETE PO Take 1,000 Units by mouth daily.       History (reviewed): Past Medical History:  Diagnosis Date  .  GERD (gastroesophageal reflux disease)   . Hypertension    Past Surgical History:  Procedure Laterality Date  . COLONOSCOPY W/ POLYPECTOMY  2019  . RECONSTRUCTION OF NOSE  1974   had a lawnmower accident  . UPPER GI ENDOSCOPY  2019   Family History  Problem Relation Age of Onset  . COPD Father   . Heart attack Father        died from heart attack  . Cancer Sister        breast cancer  . Cancer Sister        breast cancer   Social History   Socioeconomic History  . Marital status:  Widowed    Spouse name: Not on file  . Number of children: 2  . Years of education: Not on file  . Highest education level: High school graduate  Occupational History  . Occupation: disabled  Social Needs  . Financial resource strain: Not hard at all  . Food insecurity:    Worry: Never true    Inability: Never true  . Transportation needs:    Medical: No    Non-medical: No  Tobacco Use  . Smoking status: Current Every Day Smoker    Packs/day: 0.50    Types: Cigarettes    Start date: 12/20/1969    Last attempt to quit: 12/21/1999    Years since quitting: 18.8  . Smokeless tobacco: Current User    Types: Chew  . Tobacco comment: pt had stopped for about 20 years and then started again  Substance and Sexual Activity  . Alcohol use: Yes    Alcohol/week: 10.0 standard drinks    Types: 10 Standard drinks or equivalent per week    Comment: mixed drinks and margaritas  . Drug use: No  . Sexual activity: Yes    Birth control/protection: None  Lifestyle  . Physical activity:    Days per week: 5 days    Minutes per session: 20 min  . Stress: Not at all  Relationships  . Social connections:    Talks on phone: More than three times a week    Gets together: Once a week    Attends religious service: Never    Active member of club or organization: No    Attends meetings of clubs or organizations: Never    Relationship status: Widowed  Other Topics Concern  . Not on file  Social History Narrative   Lives with his girlfriend    Activities of Daily Living In your present state of health, do you have any difficulty performing the following activities: 10/10/2018  Hearing? Y  Comment pt has hearing aids but he doesn't wear them all the time  Vision? N  Difficulty concentrating or making decisions? N  Walking or climbing stairs? N  Dressing or bathing? N  Doing errands, shopping? N  Preparing Food and eating ? N  Using the Toilet? N  In the past six months, have you accidently  leaked urine? N  Do you have problems with loss of bowel control? N  Managing your Medications? N  Managing your Finances? N  Housekeeping or managing your Housekeeping? N  Some recent data might be hidden    Patient Literacy How often do you need to have someone help you when you read instructions, pamphlets, or other written materials from your doctor or pharmacy?: 1 - Never What is the last grade level you completed in school?: 12th grade  Exercise Current Exercise Habits: Home exercise routine, Type  of exercise: walking, Time (Minutes): 20, Frequency (Times/Week): 4, Weekly Exercise (Minutes/Week): 80, Intensity: Mild, Exercise limited by: orthopedic condition(s)  Diet Patient reports consuming 2 meals a day and 0 snack(s) a day Patient reports that his primary diet is: Regular Patient reports that she does have regular access to food.   Depression Screen PHQ 2/9 Scores 10/10/2018 03/27/2018 12/24/2017 06/25/2017 11/27/2016 09/20/2016 05/29/2016  PHQ - 2 Score 0 0 0 0 0 1 0     Fall Risk Fall Risk  10/10/2018 03/27/2018 12/24/2017 06/25/2017 11/27/2016  Falls in the past year? 0 0 No No No  Number falls in past yr: - 0 - - -  Injury with Fall? - 0 - - -     Objective:  Todd Snow seemed alert and oriented and he participated appropriately during our telephone visit.  Blood Pressure Weight BMI  BP Readings from Last 3 Encounters:  03/27/18 117/81  12/24/17 107/65  06/25/17 126/74   Wt Readings from Last 3 Encounters:  03/27/18 172 lb 3.2 oz (78.1 kg)  12/24/17 174 lb 6.4 oz (79.1 kg)  06/25/17 179 lb 3.2 oz (81.3 kg)   BMI Readings from Last 1 Encounters:  03/27/18 28.66 kg/m    *Unable to obtain current vital signs, weight, and BMI due to telephone visit type  Hearing/Vision  . Khiem did not seem to have difficulty with hearing/understanding during the telephone conversation . Reports that he has not had a formal eye exam by an eye care professional within the past year .  Reports that he has not had a formal hearing evaluation within the past year *Unable to fully assess hearing and vision during telephone visit type  Cognitive Function: 6CIT Screen 10/10/2018  What Year? 0 points  What month? 0 points  What time? 0 points  Count back from 20 0 points  Months in reverse 2 points  Repeat phrase 2 points  Total Score 4    Normal Cognitive Function Screening: Yes (Normal:0-7, Significant for Dysfunction: >8)  Immunization & Health Maintenance Record Immunization History  Administered Date(s) Administered  . Influenza, High Dose Seasonal PF 03/31/2017, 03/08/2018  . Pneumococcal Conjugate-13 03/31/2017  . Pneumococcal Polysaccharide-23 04/15/2018  . Tdap 10/21/2015    Health Maintenance  Topic Date Due  . INFLUENZA VACCINE  12/21/2018  . COLONOSCOPY  09/15/2021  . TETANUS/TDAP  10/20/2025  . Hepatitis C Screening  Completed  . PNA vac Low Risk Adult  Completed       Assessment  This is a routine wellness examination for Todd Snow.  Health Maintenance: Due or Overdue There are no preventive care reminders to display for this patient.  Todd Snow does not need a referral for [employer] Assistance: Care Management:   no Social Work:    no Prescription Assistance:  no Nutrition/Diabetes Education:  no   Plan:  Personalized Goals Goals Addressed            This Visit's Progress   . DIET - INCREASE LEAN PROTEINS        Personalized Health Maintenance & Screening Recommendations  Shingles Vaccine  Lung Cancer Screening Recommended: no (Low Dose CT Chest recommended if Age 74-80 years, 30 pack-year currently smoking OR have quit w/in past 15 years) Hepatitis C Screening recommended: no HIV Screening recommended: no  Advanced Directives: Written information was not prepared per patient's request.  Referrals & Orders No orders of the defined types were placed in this encounter.   Follow-up Plan .  Follow-up with Mechele Claude, MD as planned . Consider shingles vaccine    I have personally reviewed and noted the following in the patient's chart:   . Medical and social history . Use of alcohol, tobacco or illicit drugs  . Current medications and supplements . Functional ability and status . Nutritional status . Physical activity . Advanced directives . List of other physicians . Hospitalizations, surgeries, and ER visits in previous 12 months . Vitals . Screenings to include cognitive, depression, and falls . Referrals and appointments  In addition, I have reviewed and discussed with Todd Snow certain preventive protocols, quality metrics, and best practice recommendations. A written personalized care plan for preventive services as well as general preventive health recommendations is available and can be mailed to the patient at his request.      signature  10/10/2018

## 2018-10-22 ENCOUNTER — Ambulatory Visit: Payer: Medicare Other | Admitting: Pediatrics

## 2018-10-22 ENCOUNTER — Other Ambulatory Visit: Payer: Self-pay

## 2018-10-22 ENCOUNTER — Encounter (INDEPENDENT_AMBULATORY_CARE_PROVIDER_SITE_OTHER): Payer: Self-pay

## 2018-10-22 ENCOUNTER — Ambulatory Visit: Payer: Medicare Other | Admitting: Family Medicine

## 2018-10-23 ENCOUNTER — Encounter: Payer: Self-pay | Admitting: Family Medicine

## 2018-10-23 ENCOUNTER — Ambulatory Visit (INDEPENDENT_AMBULATORY_CARE_PROVIDER_SITE_OTHER): Payer: Medicare Other | Admitting: Family Medicine

## 2018-10-23 VITALS — BP 116/74 | HR 86 | Temp 97.2°F | Ht 65.0 in | Wt 180.0 lb

## 2018-10-23 DIAGNOSIS — I10 Essential (primary) hypertension: Secondary | ICD-10-CM | POA: Diagnosis not present

## 2018-10-23 DIAGNOSIS — N400 Enlarged prostate without lower urinary tract symptoms: Secondary | ICD-10-CM | POA: Diagnosis not present

## 2018-10-23 DIAGNOSIS — K220 Achalasia of cardia: Secondary | ICD-10-CM | POA: Diagnosis not present

## 2018-10-23 DIAGNOSIS — K439 Ventral hernia without obstruction or gangrene: Secondary | ICD-10-CM | POA: Diagnosis not present
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
## 2018-10-31 ENCOUNTER — Telehealth: Payer: Self-pay | Admitting: Family Medicine

## 2018-10-31 NOTE — Telephone Encounter (Signed)
Called pt - aware that General Surgeon referral is in the works.  He also mentioned a GI referal = as does your note.  He has seen GI in past = should he see that GI - or are we needing a new referral = pt is unclear on what you wanted him to do.

## 2018-11-01 NOTE — Telephone Encounter (Signed)
I wanted him to see GI at [facility] as he needs testing for achalasia. The test for that is not done in a lot of places, but [facility] does do it. It is called esophageal manometry.

## 2018-11-01 NOTE — Telephone Encounter (Signed)
Patient aware and verbalizes understanding. 

## 2018-11-27 ENCOUNTER — Encounter: Payer: Self-pay | Admitting: *Deleted

## 2019-04-24 ENCOUNTER — Ambulatory Visit: Payer: Medicare Other | Admitting: Family Medicine

## 2019-04-25 ENCOUNTER — Ambulatory Visit: Payer: Medicare Other | Admitting: Family Medicine

## 2019-04-29 ENCOUNTER — Encounter: Payer: Self-pay | Admitting: Family Medicine

## 2019-04-29 ENCOUNTER — Other Ambulatory Visit: Payer: Self-pay

## 2019-04-29 ENCOUNTER — Ambulatory Visit (INDEPENDENT_AMBULATORY_CARE_PROVIDER_SITE_OTHER): Payer: Medicare Other | Admitting: Family Medicine

## 2019-04-29 VITALS — BP 106/65 | HR 85 | Temp 99.0°F | Resp 20 | Ht 65.0 in | Wt 175.0 lb

## 2019-04-29 DIAGNOSIS — R222 Localized swelling, mass and lump, trunk: Secondary | ICD-10-CM

## 2019-04-29 DIAGNOSIS — I10 Essential (primary) hypertension: Secondary | ICD-10-CM

## 2019-04-29 DIAGNOSIS — R1900 Intra-abdominal and pelvic swelling, mass and lump, unspecified site: Secondary | ICD-10-CM

## 2019-04-29 NOTE — Progress Notes (Addendum)
Subjective:  Patient ID: Todd Snow, male    DOB: [birth year]  Age: 73 y.o. MRN: [id number]  CC: Medical Management of Chronic Issues (6 mo ) and Hypertension   HPI Todd Snow presents for  presents for  follow-up of hypertension. Patient has no history of headache chest pain or shortness of breath or recent cough. Patient also denies symptoms of TIA such as focal numbness or weakness. Patient denies side effects from medication. States taking it regularly.  Also wants another opinion regarding the ventral hernia. He says the surgeon didn't even touch him. He just looked at him and said he just needed to lose weight.   Depression screen [facility] 2/9 04/29/2019 10/23/2018 10/10/2018  Decreased Interest 0 0 0  Down, Depressed, Hopeless 0 0 0  PHQ - 2 Score 0 0 0    History Todd Snow has a past medical history of GERD (gastroesophageal reflux disease) and Hypertension.   He has a past surgical history that includes Reconstruction of nose (1974); Upper gi endoscopy (2019); and Colonoscopy w/ polypectomy (2019).   His family history includes COPD in his father; Cancer in his sister and sister; Heart attack in his father.He reports that he has been smoking cigarettes. He started smoking about 49 years ago. He has been smoking about 0.50 packs per day. His smokeless tobacco use includes chew. He reports current alcohol use of about 10.0 standard drinks of alcohol per week. He reports that he does not use drugs.    ROS Review of Systems  Constitutional: Negative for fever.  Respiratory: Negative for shortness of breath.   Cardiovascular: Negative for chest pain.  Gastrointestinal: Positive for abdominal distention, abdominal pain and nausea (from PPI. Stopped taking it and sx resolved.). Negative for anal bleeding.  Musculoskeletal: Negative for arthralgias.  Skin: Negative for rash.    Objective:  BP 106/65   Pulse 85   Temp 99 F (37.2 C)   Resp 20   Ht 5\' 5"  (1.651 m)   Wt 175 lb (79.4  kg)   SpO2 96%   BMI 29.12 kg/m   BP Readings from Last 3 Encounters:  04/29/19 106/65  10/23/18 116/74  03/27/18 117/81    Wt Readings from Last 3 Encounters:  04/29/19 175 lb (79.4 kg)  10/23/18 180 lb (81.6 kg)  03/27/18 172 lb 3.2 oz (78.1 kg)     Physical Exam Vitals signs reviewed.  Constitutional:      Appearance: He is well-developed.  HENT:     Head: Normocephalic and atraumatic.     Right Ear: Tympanic membrane and external ear normal. No decreased hearing noted.     Left Ear: Tympanic membrane and external ear normal. No decreased hearing noted.     Mouth/Throat:     Pharynx: No oropharyngeal exudate or posterior oropharyngeal erythema.  Eyes:     Pupils: Pupils are equal, round, and reactive to light.  Neck:     Musculoskeletal: Normal range of motion and neck supple.  Cardiovascular:     Rate and Rhythm: Normal rate and regular rhythm.     Heart sounds: No murmur.  Pulmonary:     Effort: No respiratory distress.     Breath sounds: Normal breath sounds.  Abdominal:     General: Bowel sounds are normal. There is distension (protuberant. Possible ventral hernia, but may simply be fat pad between aponeuroses).     Palpations: Abdomen is soft. There is no mass.     Tenderness: There is  abdominal tenderness.       Assessment & Plan:   Oluwaseun was seen today for medical management of chronic issues and hypertension.  Diagnoses and all orders for this visit:  Abdominal wall mass  Intra-abdominal and pelvic swelling, mass and lump, unspecified site -     CT ABDOMEN W CONTRAST; Future  Essential hypertension       I have discontinued Korver C. Mirando's multivitamin and pantoprazole. I am also having him maintain his triamcinolone cream, diazepam, Oxycodone HCl, fluticasone, ASCORBIC ACID PO, and Multiple Vitamins-Minerals (VITAMIN D3 COMPLETE PO).  Allergies as of 04/29/2019      Reactions   Morphine And Related Itching, Swelling   Throat Swelling    Meperidine Nausea And Vomiting   Penicillins Nausea And Vomiting, Rash   Tamsulosin Itching   Vision blurred   Flomax [tamsulosin Hcl] Other (See Comments)   Vision blurred      Medication List       Accurate as of April 29, 2019  7:31 PM. If you have any questions, ask your nurse or doctor.        STOP taking these medications   multivitamin tablet Stopped by: Mechele Claude, MD   pantoprazole 40 MG tablet Commonly known as: PROTONIX Stopped by: Mechele Claude, MD     TAKE these medications   ASCORBIC ACID PO Take 1,000 mg by mouth daily.   diazepam 10 MG tablet Commonly known as: VALIUM Take 1 tablet by mouth 2 (two) times daily as needed. For anxiety   fluticasone 50 MCG/ACT nasal spray Commonly known as: FLONASE Place 2 sprays into both nostrils daily.   Oxycodone HCl 10 MG Tabs TAKE 1 TO 2 TABLETS BY MOUTH 4 TIMES A DAY   triamcinolone cream 0.1 % Commonly known as: KENALOG Apply twice daily   VITAMIN D3 COMPLETE PO Take 1,000 Units by mouth daily.        Follow-up: Return in about 6 months (around 10/28/2019).  Mechele Claude, M.D.

## 2019-05-20 ENCOUNTER — Ambulatory Visit (HOSPITAL_COMMUNITY): Payer: Medicare Other

## 2019-07-28 ENCOUNTER — Telehealth: Payer: Self-pay | Admitting: Family Medicine

## 2019-07-28 NOTE — Telephone Encounter (Signed)
Please schedule and call patient back on cell when scheduled.  [phone]

## 2019-07-28 NOTE — Telephone Encounter (Signed)
Needs appt for CT scan of Abdomin rescheduled with someone in [place] or [place].

## 2019-07-29 NOTE — Telephone Encounter (Signed)
Patient has new appt on 3/23 @ [facility] in [place]. - Patient stated he has taken if wife there and did not mind being scheduled there.

## 2019-07-29 NOTE — Telephone Encounter (Signed)
That works great for me. Do you need a new referral? Thanks, WS

## 2019-07-30 NOTE — Telephone Encounter (Signed)
No Sir - He just needed to be rescheduled at a facility that was closer to him :) Thanks for all your help!

## 2019-07-30 NOTE — Telephone Encounter (Signed)
Thank you! I just wanted to be sure! WS

## 2019-08-01 ENCOUNTER — Other Ambulatory Visit: Payer: Medicare Other

## 2019-08-12 ENCOUNTER — Ambulatory Visit (HOSPITAL_COMMUNITY)
Admission: RE | Admit: 2019-08-12 | Discharge: 2019-08-12 | Disposition: A | Discharge status: Home / Self Care | Payer: Medicare Other | Source: Ambulatory Visit | Attending: Family Medicine | Admitting: Family Medicine

## 2019-08-12 ENCOUNTER — Other Ambulatory Visit: Payer: Self-pay | Admitting: Family Medicine

## 2019-08-12 ENCOUNTER — Other Ambulatory Visit: Payer: Self-pay

## 2019-08-12 ENCOUNTER — Telehealth: Payer: Self-pay | Admitting: Family Medicine

## 2019-08-12 DIAGNOSIS — R1900 Intra-abdominal and pelvic swelling, mass and lump, unspecified site: Secondary | ICD-10-CM

## 2019-08-12 DIAGNOSIS — I10 Essential (primary) hypertension: Secondary | ICD-10-CM

## 2019-08-12 LAB — POCT I-STAT CREATININE: Creatinine, Ser: 0.8 mg/dL (ref 0.61–1.24)

## 2019-08-12 MED ORDER — IOHEXOL 300 MG/ML  SOLN
100.0000 mL | Freq: Once | INTRAMUSCULAR | Status: AC | PRN
  Administered 2019-08-12: 100 mL via INTRAVENOUS

## 2019-08-12 NOTE — Telephone Encounter (Signed)
Per Dr. Darlyn Read ok to do Abd and pelvis.

## 2019-08-12 NOTE — Telephone Encounter (Signed)
Abd scan is adequate this time. Istat creatinine ordered.

## 2019-08-12 NOTE — Telephone Encounter (Signed)
Todd Snow called from [facility] CT Dept wanting to clarify if CT scan is just for the abdomin or pelvis also? If so, they need order for pelvis. Also says they need order to check kidney functions.

## 2019-10-28 ENCOUNTER — Encounter: Payer: Self-pay | Admitting: Family Medicine

## 2019-10-28 ENCOUNTER — Other Ambulatory Visit: Payer: Self-pay

## 2019-10-28 ENCOUNTER — Ambulatory Visit (INDEPENDENT_AMBULATORY_CARE_PROVIDER_SITE_OTHER): Payer: Medicare Other | Admitting: Family Medicine

## 2019-10-28 VITALS — BP 119/69 | HR 76 | Temp 97.8°F | Resp 20 | Ht 65.0 in | Wt 172.2 lb

## 2019-10-28 DIAGNOSIS — N400 Enlarged prostate without lower urinary tract symptoms: Secondary | ICD-10-CM

## 2019-10-28 DIAGNOSIS — I10 Essential (primary) hypertension: Secondary | ICD-10-CM

## 2019-10-28 NOTE — Progress Notes (Signed)
Subjective:  Patient ID: Todd Snow, male    DOB: 1946-03-31  Age: 74 y.o. MRN: [id number]  CC: Medical Management of Chronic Issues   HPI Todd Snow presents for  follow-up of hypertension. Patient has no history of headache chest pain or shortness of breath or recent cough. Patient also denies symptoms of TIA such as focal numbness or weakness. Patient denies side effects from medication. States taking it regularly.   History Todd Snow has a past medical history of GERD (gastroesophageal reflux disease) and Hypertension.   He has a past surgical history that includes Reconstruction of nose (1974); Upper gi endoscopy (2019); and Colonoscopy w/ polypectomy (2019).   His family history includes COPD in his father; Cancer in his sister and sister; Heart attack in his father.He reports that he has been smoking cigarettes. He started smoking about 49 years ago. He has been smoking about 0.50 packs per day. His smokeless tobacco use includes chew. He reports current alcohol use of about 10.0 standard drinks of alcohol per week. He reports that he does not use drugs.  Current Outpatient Medications on File Prior to Visit  Medication Sig Dispense Refill  . ASCORBIC ACID PO Take 1,000 mg by mouth daily.    . diazepam (VALIUM) 10 MG tablet Take 1 tablet by mouth 2 (two) times daily as needed. For anxiety  2  . fluticasone (FLONASE) 50 MCG/ACT nasal spray Place 2 sprays into both nostrils daily. 16 g 6  . Oxycodone HCl 10 MG TABS TAKE 1 TO 2 TABLETS BY MOUTH 4 TIMES A DAY  0  . triamcinolone cream (KENALOG) 0.1 % Apply twice daily    . Multiple Vitamins-Minerals (VITAMIN D3 COMPLETE PO) Take 1,000 Units by mouth daily.     No current facility-administered medications on file prior to visit.    ROS Review of Systems  Constitutional: Negative for fever.  Respiratory: Negative for shortness of breath.   Cardiovascular: Negative for chest pain.  Musculoskeletal: Negative for arthralgias.   Skin: Negative for rash.    Objective:  BP 119/69   Pulse 76   Temp 97.8 F (36.6 C) (Temporal)   Resp 20   Ht 5\' 5"  (1.651 m)   Wt 172 lb 4 oz (78.1 kg)   SpO2 93%   BMI 28.66 kg/m   BP Readings from Last 3 Encounters:  10/28/19 119/69  04/29/19 106/65  10/23/18 116/74    Wt Readings from Last 3 Encounters:  10/28/19 172 lb 4 oz (78.1 kg)  04/29/19 175 lb (79.4 kg)  10/23/18 180 lb (81.6 kg)     Physical Exam Vitals reviewed.  Constitutional:      Appearance: He is well-developed.  HENT:     Head: Normocephalic and atraumatic.     Right Ear: Tympanic membrane and external ear normal. No decreased hearing noted.     Left Ear: Tympanic membrane and external ear normal. No decreased hearing noted.     Mouth/Throat:     Pharynx: No oropharyngeal exudate or posterior oropharyngeal erythema.  Eyes:     Pupils: Pupils are equal, round, and reactive to light.  Cardiovascular:     Rate and Rhythm: Normal rate and regular rhythm.     Heart sounds: No murmur.  Pulmonary:     Effort: No respiratory distress.     Breath sounds: Normal breath sounds.  Abdominal:     Palpations: Abdomen is soft. There is no mass.     Tenderness: There is no  abdominal tenderness.  Musculoskeletal:     Cervical back: Normal range of motion and neck supple.       Assessment & Plan:   Todd Snow was seen today for medical management of chronic issues.  Diagnoses and all orders for this visit:  Benign prostatic hyperplasia without lower urinary tract symptoms  Essential hypertension   Allergies as of 10/28/2019      Reactions   Morphine And Related Itching, Swelling   Throat Swelling   Meperidine Nausea And Vomiting   Penicillins Nausea And Vomiting, Rash   Tamsulosin Itching   Vision blurred   Flomax [tamsulosin Hcl] Other (See Comments)   Vision blurred      Medication List       Accurate as of October 28, 2019 10:16 PM. If you have any questions, ask your nurse or doctor.         ASCORBIC ACID PO Take 1,000 mg by mouth daily.   diazepam 10 MG tablet Commonly known as: VALIUM Take 1 tablet by mouth 2 (two) times daily as needed. For anxiety   fluticasone 50 MCG/ACT nasal spray Commonly known as: FLONASE Place 2 sprays into both nostrils daily.   Oxycodone HCl 10 MG Tabs TAKE 1 TO 2 TABLETS BY MOUTH 4 TIMES A DAY   triamcinolone cream 0.1 % Commonly known as: KENALOG Apply twice daily   VITAMIN D3 COMPLETE PO Take 1,000 Units by mouth daily.       No orders of the defined types were placed in this encounter.     Follow-up: No follow-ups on file.  Todd Snow, M.D.

## 2020-03-10 ENCOUNTER — Ambulatory Visit (INDEPENDENT_AMBULATORY_CARE_PROVIDER_SITE_OTHER): Payer: Medicare Other

## 2020-03-10 DIAGNOSIS — Z0000 Encounter for general adult medical examination without abnormal findings: Secondary | ICD-10-CM

## 2020-03-10 NOTE — Progress Notes (Signed)
MEDICARE ANNUAL WELLNESS VISIT  03/10/2020  Telephone Visit Disclaimer This Medicare AWV was conducted by telephone due to national recommendations for restrictions regarding the COVID-19 Pandemic (e.g. social distancing).  I verified, using two identifiers, that I am speaking with Todd Snow or their authorized healthcare agent. I discussed the limitations, risks, security, and privacy concerns of performing an evaluation and management service by telephone and the potential availability of an in-person appointment in the future. The patient expressed understanding and agreed to proceed.  Location of Patient: Home Location of Provider (nurse):  [facility]  Subjective:    Todd Snow is a 74 y.o. male patient of Stacks, Broadus John, MD who had a Medicare Annual Wellness Visit today via telephone. Saiquan resides in nearby [facility] with his girlfriend of 4 years. He states that he has been married twice and has 2 children by his first wife. He is disabled but before becoming disabled he worked for [employer]. He walks daily for exercise. He started smoking again 4 years ago after being quit for 20 years. He doesn't really like to do anything in his free time. He is wanting information on an Transport planner. Advised I would speak with our referral lady and back to her him.   Patient Care Team: Mechele Claude, MD as PCP - General (Family Medicine)  Advanced Directives 03/10/2020 10/10/2018  Does Patient Have a Medical Advance Directive? No No  Would patient like information on creating a medical advance directive? No - Patient declined No - Patient declined    Hospital Utilization Over the Past 12 Months: # of hospitalizations or ER visits: 0 # of surgeries: 0  Review of Systems    Patient reports that his overall health is worse compared to last year.  History obtained from chart review  Patient Reported Readings (BP, Pulse, CBG, Weight,  etc) none  Pain Assessment Pain : 0-10 Pain Score: 6  Pain Type: Chronic pain Pain Location: Back Pain Descriptors / Indicators: Shooting Pain Onset: More than a month ago Pain Frequency: Intermittent Pain Relieving Factors: Sees pain clinic and has been for 10 years  Pain Relieving Factors: Sees pain clinic and has been for 10 years  Current Medications & Allergies (verified) Allergies as of 03/10/2020      Reactions   Morphine And Related Itching, Swelling   Throat Swelling   Meperidine Nausea And Vomiting   Penicillins Nausea And Vomiting, Rash   Tamsulosin Itching   Vision blurred   Flomax [tamsulosin Hcl] Other (See Comments)   Vision blurred      Medication List       Accurate as of March 10, 2020  2:04 PM. If you have any questions, ask your nurse or doctor.        STOP taking these medications   ASCORBIC ACID PO     TAKE these medications   diazepam 10 MG tablet Commonly known as: VALIUM Take 1 tablet by mouth 2 (two) times daily as needed. For anxiety   fluticasone 50 MCG/ACT nasal spray Commonly known as: FLONASE Place 2 sprays into both nostrils daily.   Oxycodone HCl 10 MG Tabs TAKE 1 TO 2 TABLETS BY MOUTH 4 TIMES A DAY   triamcinolone cream 0.1 % Commonly known as: KENALOG Apply twice daily   VITAMIN D3 COMPLETE PO Take 1,000 Units by mouth daily.       History (reviewed): Past Medical History:  Diagnosis Date  . GERD (gastroesophageal  reflux disease)   . Hypertension    Past Surgical History:  Procedure Laterality Date  . COLONOSCOPY W/ POLYPECTOMY  2019  . RECONSTRUCTION OF NOSE  1974   had a lawnmower accident  . UPPER GI ENDOSCOPY  2019   Family History  Problem Relation Age of Onset  . Suicidality Mother   . COPD Father   . Heart attack Father        died from heart attack  . Cancer Sister        breast cancer  . Cancer Sister        breast cancer   Social History   Socioeconomic History  . Marital status:  Widowed    Spouse name: Not on file  . Number of children: 2  . Years of education: Not on file  . Highest education level: High school graduate  Occupational History  . Occupation: disabled  Tobacco Use  . Smoking status: Current Every Day Smoker    Packs/day: 0.50    Types: Cigarettes    Start date: 12/20/1969    Last attempt to quit: 12/21/1999    Years since quitting: 20.2  . Smokeless tobacco: Current User    Types: Chew  . Tobacco comment: pt had stopped for about 20 years and then started again  Vaping Use  . Vaping Use: Never used  Substance and Sexual Activity  . Alcohol use: Yes    Alcohol/week: 10.0 standard drinks    Types: 10 Standard drinks or equivalent per week    Comment: mixed drinks and margaritas  . Drug use: No  . Sexual activity: Yes    Birth control/protection: None  Other Topics Concern  . Not on file  Social History Narrative   Lives with his girlfriend   Social Determinants of Health   Financial Resource Strain:   . Difficulty of Paying Living Expenses: Not on file  Food Insecurity:   . Worried About Programme researcher, broadcasting/film/videounning Out of Food in the Last Year: Not on file  . Ran Out of Food in the Last Year: Not on file  Transportation Needs:   . Lack of Transportation (Medical): Not on file  . Lack of Transportation (Non-Medical): Not on file  Physical Activity:   . Days of Exercise per Week: Not on file  . Minutes of Exercise per Session: Not on file  Stress:   . Feeling of Stress : Not on file  Social Connections:   . Frequency of Communication with Friends and Family: Not on file  . Frequency of Social Gatherings with Friends and Family: Not on file  . Attends Religious Services: Not on file  . Active Member of Clubs or Organizations: Not on file  . Attends BankerClub or Organization Meetings: Not on file  . Marital Status: Not on file    Activities of Daily Living In your present state of health, do you have any difficulty performing the following activities:  03/10/2020  Hearing? N  Vision? N  Difficulty concentrating or making decisions? N  Walking or climbing stairs? N  Dressing or bathing? N  Doing errands, shopping? N  Preparing Food and eating ? N  Using the Toilet? N  In the past six months, have you accidently leaked urine? N  Do you have problems with loss of bowel control? N  Managing your Medications? N  Managing your Finances? N  Housekeeping or managing your Housekeeping? N  Some recent data might be hidden    Patient Education/ Literacy How  often do you need to have someone help you when you read instructions, pamphlets, or other written materials from your doctor or pharmacy?: 1 - Never What is the last grade level you completed in school?: 12th grade  Exercise Current Exercise Habits: The patient does not participate in regular exercise at present, Exercise limited by: orthopedic condition(s)  Diet Patient reports consuming 3 meals a day and 2 snack(s) a day Patient reports that his primary diet is: Regular Patient reports that she does have regular access to food.   Depression Screen PHQ 2/9 Scores 03/10/2020 10/28/2019 04/29/2019 10/23/2018 10/10/2018 03/27/2018 12/24/2017  PHQ - 2 Score 0 0 0 0 0 0 0     Fall Risk Fall Risk  03/10/2020 10/28/2019 04/29/2019 10/23/2018 10/10/2018  Falls in the past year? 0 0 0 0 0  Number falls in past yr: - - - - -  Injury with Fall? - - - - -  Follow up - Falls evaluation completed - - -     Objective:  Kowen C Yahr seemed alert and oriented and he participated appropriately during our telephone visit.  Blood Pressure Weight BMI  BP Readings from Last 3 Encounters:  10/28/19 119/69  04/29/19 106/65  10/23/18 116/74   Wt Readings from Last 3 Encounters:  10/28/19 172 lb 4 oz (78.1 kg)  04/29/19 175 lb (79.4 kg)  10/23/18 180 lb (81.6 kg)   BMI Readings from Last 1 Encounters:  10/28/19 28.66 kg/m    *Unable to obtain current vital signs, weight, and BMI due to telephone visit  type  Hearing/Vision  . Nakeem did not seem to have difficulty with hearing/understanding during the telephone conversation . Reports that he has not had a formal eye exam by an eye care professional within the past year . Reports that he has not had a formal hearing evaluation within the past year *Unable to fully assess hearing and vision during telephone visit type  Cognitive Function: 6CIT Screen 03/10/2020 10/10/2018  What Year? 0 points 0 points  What month? 0 points 0 points  What time? 0 points 0 points  Count back from 20 0 points 0 points  Months in reverse 0 points 2 points  Repeat phrase 0 points 2 points  Total Score 0 4   (Normal:0-7, Significant for Dysfunction: >8)  Normal Cognitive Function Screening: Yes   Immunization & Health Maintenance Record Immunization History  Administered Date(s) Administered  . Fluad Quad(high Dose 65+) 01/30/2019  . Influenza, High Dose Seasonal PF 03/31/2017, 03/08/2018  . Pneumococcal Conjugate-13 03/31/2017  . Pneumococcal Polysaccharide-23 04/15/2018  . Tdap 10/21/2015    Health Maintenance  Topic Date Due  . COVID-19 Vaccine (1) Never done  . INFLUENZA VACCINE  12/21/2019  . COLONOSCOPY  09/15/2021  . TETANUS/TDAP  10/20/2025  . Hepatitis C Screening  Completed  . PNA vac Low Risk Adult  Completed       Assessment  This is a routine wellness examination for Cornelious C Marker.  Health Maintenance: Due or Overdue Health Maintenance Due  Topic Date Due  . COVID-19 Vaccine (1) Never done  . INFLUENZA VACCINE  12/21/2019    Rithy Izora Snow does not need a referral for Community Assistance: Care Management:   no Social Work:    no Prescription Assistance:  no Nutrition/Diabetes Education:  no   Plan:  Personalized Goals Goals Addressed   None    Personalized Health Maintenance & Screening Recommendations  Influenza vaccine  Lung Cancer Screening Recommended:  no (Low Dose CT Chest recommended if Age 43-80 years,  30 pack-year currently smoking OR have quit w/in past 15 years) Hepatitis C Screening recommended: Done HIV Screening recommended: not applicable  Advanced Directives: Written information was not prepared per patient's request.  Referrals & Orders No orders of the defined types were placed in this encounter.   Follow-up Plan . Follow-up with Mechele Claude, MD as planned . Schedule for yearly flu shot    I have personally reviewed and noted the following in the patient's chart:   . Medical and social history . Use of alcohol, tobacco or illicit drugs  . Current medications and supplements . Functional ability and status . Nutritional status . Physical activity . Advanced directives . List of other physicians . Hospitalizations, surgeries, and ER visits in previous 12 months . Vitals . Screenings to include cognitive, depression, and falls . Referrals and appointments  In addition, I have reviewed and discussed with Skyler Izora Snow certain preventive protocols, quality metrics, and best practice recommendations. A written personalized care plan for preventive services as well as general preventive health recommendations is available and can be mailed to the patient at his request.      Cleda Daub LPN [id number]

## 2020-03-10 NOTE — Patient Instructions (Signed)
  Todd Snow , Thank you for taking time to come for your Medicare Wellness Visit. I appreciate your ongoing commitment to your health goals. Please review the following plan we discussed and let me know if I can assist you in the future.   These are the goals we discussed: Goals    . DIET - INCREASE LEAN PROTEINS       This is a list of the screening recommended for you and due dates:  Health Maintenance  Topic Date Due  . COVID-19 Vaccine (1) Never done  . Flu Shot  12/21/2019  . Colon Cancer Screening  09/15/2021  . Tetanus Vaccine  10/20/2025  .  Hepatitis C: One time screening is recommended by Center for Disease Control  (CDC) for  adults born from [birth year] through 1967.   Completed  . Pneumonia vaccines  Completed

## 2020-03-26 ENCOUNTER — Telehealth: Payer: Self-pay | Admitting: Family Medicine

## 2020-03-26 NOTE — Telephone Encounter (Signed)
Pt stated that he got up this morning and took a drink of soda then started to feel dizzy, he took his bp and it was 117/70

## 2020-03-26 NOTE — Telephone Encounter (Signed)
Called patient states he was dizzy this morning feel some better. He is going to give [place] a call Monday if no better to make an appointment.

## 2020-04-28 ENCOUNTER — Other Ambulatory Visit: Payer: Self-pay

## 2020-04-28 ENCOUNTER — Encounter: Payer: Self-pay | Admitting: Family Medicine

## 2020-04-28 ENCOUNTER — Ambulatory Visit (INDEPENDENT_AMBULATORY_CARE_PROVIDER_SITE_OTHER): Payer: Medicare Other | Admitting: Family Medicine

## 2020-04-28 VITALS — BP 108/66 | HR 80 | Temp 97.2°F | Resp 20 | Ht 65.0 in | Wt 175.0 lb

## 2020-04-28 DIAGNOSIS — R42 Dizziness and giddiness: Secondary | ICD-10-CM | POA: Diagnosis not present

## 2020-04-28 DIAGNOSIS — Z125 Encounter for screening for malignant neoplasm of prostate: Secondary | ICD-10-CM | POA: Diagnosis not present

## 2020-04-28 DIAGNOSIS — I10 Essential (primary) hypertension: Secondary | ICD-10-CM

## 2020-04-28 DIAGNOSIS — N400 Enlarged prostate without lower urinary tract symptoms: Secondary | ICD-10-CM

## 2020-04-28 NOTE — Progress Notes (Signed)
Subjective:  Patient ID: Todd Snow, male    DOB: Feb 17, 1946  Age: 74 y.o. MRN: [id number]  CC: Medical Management of Chronic Issues   HPI Todd Snow presents for follow-up of his chronic medical concerns.  His most pressing concern today however is dizziness.  He says when he first gets up in the mornings he feels off balance.  It is not repeated through the day though.  It gets better as the day goes by.  He denies any feeling of lightheadedness or loss of consciousness.  Todd Snow relates that he was a smoker from age 72 to about age 59.  Then he started again about 3 to 4 years ago.  He did not smoke any time between when he was 84 and 58 for sure.   Follow-up BPH this is currently asymptomatic without treatment.   Follow-up of hypertension. Patient has no history of headache chest pain or shortness of breath or recent cough. Patient also denies symptoms of TIA such as numbness weakness lateralizing. Patient checks  blood pressure at home and has not had any elevated readings recently. Patient denies side effects from his medication. States taking it regularly.  Depression screen [facility] 2/9 04/28/2020 03/10/2020 10/28/2019  Decreased Interest 0 0 0  Down, Depressed, Hopeless 0 0 0  PHQ - 2 Score 0 0 0    History Todd Snow has a past medical history of GERD (gastroesophageal reflux disease) and Hypertension.   He has a past surgical history that includes Reconstruction of nose (1974); Upper gi endoscopy (2019); and Colonoscopy w/ polypectomy (2019).   His family history includes COPD in his father; Cancer in his sister and sister; Heart attack in his father; Suicidality in his mother.He reports that he has been smoking cigarettes. He started smoking about 50 years ago. He has been smoking about 0.50 packs per day. His smokeless tobacco use includes chew. He reports current alcohol use of about 10.0 standard drinks of alcohol per week. He reports that he does not use  drugs.    ROS Review of Systems  Constitutional: Negative for fever.  Respiratory: Negative for shortness of breath.   Cardiovascular: Negative for chest pain.  Musculoskeletal: Negative for arthralgias.  Skin: Negative for rash.    Objective:  BP 108/66   Pulse 80   Temp (!) 97.2 F (36.2 C) (Temporal)   Resp 20   Ht _0  (1.651 m)   Wt 175 lb (79.4 kg)   SpO2 96%   BMI 29.12 kg/m   BP Readings from Last 3 Encounters:  04/28/20 108/66  10/28/19 119/69  04/29/19 106/65    Wt Readings from Last 3 Encounters:  04/28/20 175 lb (79.4 kg)  10/28/19 172 lb 4 oz (78.1 kg)  04/29/19 175 lb (79.4 kg)     Physical Exam Vitals reviewed.  Constitutional:      Appearance: He is well-developed and well-nourished.  HENT:     Head: Normocephalic and atraumatic.     Right Ear: Tympanic membrane and external ear normal. No decreased hearing noted.     Left Ear: Tympanic membrane and external ear normal. No decreased hearing noted.     Mouth/Throat:     Pharynx: No oropharyngeal exudate or posterior oropharyngeal erythema.  Eyes:     Pupils: Pupils are equal, round, and reactive to light.  Cardiovascular:     Rate and Rhythm: Normal rate and regular rhythm.     Heart sounds: No murmur heard.   Pulmonary:  Effort: No respiratory distress.     Breath sounds: Normal breath sounds.  Abdominal:     General: Bowel sounds are normal.     Palpations: Abdomen is soft. There is no mass.     Tenderness: There is no abdominal tenderness.  Musculoskeletal:     Cervical back: Normal range of motion and neck supple.       Assessment & Plan:   Todd Snow was seen today for medical management of chronic issues.  Diagnoses and all orders for this visit:  Dizziness -     CBC with Differential/Platelet -     CMP14+EGFR  Screening for prostate cancer -     PR PSA, TOTAL SCREENING  Benign prostatic hyperplasia without lower urinary tract symptoms  Essential  hypertension       I am having Todd Snow maintain his triamcinolone, diazepam, Oxycodone HCl, fluticasone, and Multiple Vitamins-Minerals (VITAMIN D3 COMPLETE PO).  Allergies as of 04/28/2020      Reactions   Morphine And Related Itching, Swelling   Throat Swelling   Meperidine Nausea And Vomiting   Penicillins Nausea And Vomiting, Rash   Tamsulosin Itching   Vision blurred   Flomax [tamsulosin Hcl] Other (See Comments)   Vision blurred      Medication List       Accurate as of April 28, 2020 11:59 PM. If you have any questions, ask your nurse or doctor.        diazepam 10 MG tablet Commonly known as: VALIUM Take 1 tablet by mouth 2 (two) times daily as needed. For anxiety   fluticasone 50 MCG/ACT nasal spray Commonly known as: FLONASE Place 2 sprays into both nostrils daily.   Oxycodone HCl 10 MG Tabs TAKE 1 TO 2 TABLETS BY MOUTH 4 TIMES A DAY   triamcinolone 0.1 % Commonly known as: KENALOG Apply twice daily   VITAMIN D3 COMPLETE PO Take 1,000 Units by mouth daily.        Follow-up: Return in about 6 months (around 10/27/2020).  [name], M.D.

## 2020-05-02 ENCOUNTER — Encounter: Payer: Self-pay | Admitting: Family Medicine

## 2020-09-07 ENCOUNTER — Encounter: Payer: Self-pay | Admitting: *Deleted

## 2020-09-11 IMAGING — CT CT ABD-PELV W/ CM
2 of 5 series · 17 of 46 positions shown, 19 images · IV contrast (Omni 300)
Comparison: 01/03/2018.

CLINICAL DATA: Intra-abdominal and pelvic swelling, mass and lump.

EXAM:
CT ABDOMEN AND PELVIS WITH CONTRAST
TECHNIQUE: Multidetector CT imaging of the abdomen and pelvis was performed
using the standard protocol following bolus administration of
intravenous contrast.
CONTRAST:  100mL OMNIPAQUE IOHEXOL 300 MG/ML  SOLN

[Series 3: a/p w/ 5mm · axial · 0.90mm/px · z∈[+833,+1213]mm · 14 of 86 slices shown, 16 images]
[im 5/86  soft-tissue]
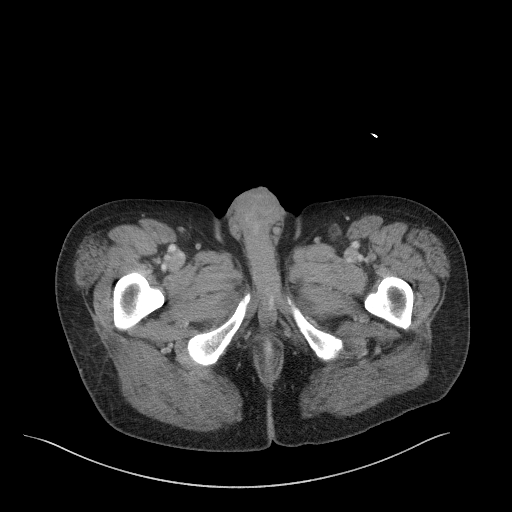
[im 5/86  bone]
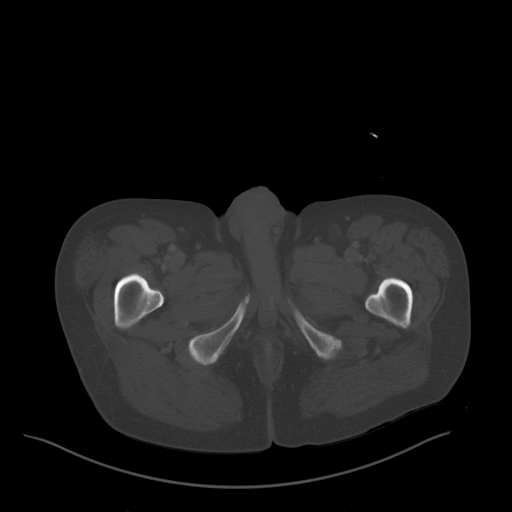
[im 13/86  soft-tissue]
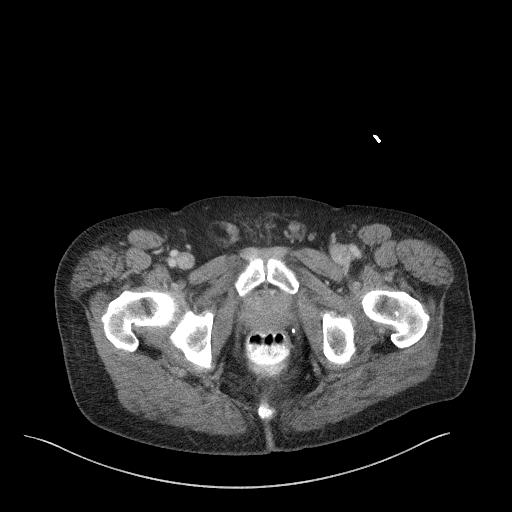
[im 18/86  soft-tissue]
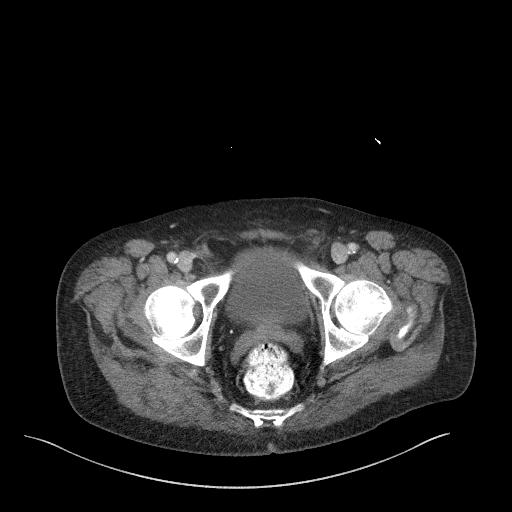
[im 22/86  soft-tissue]
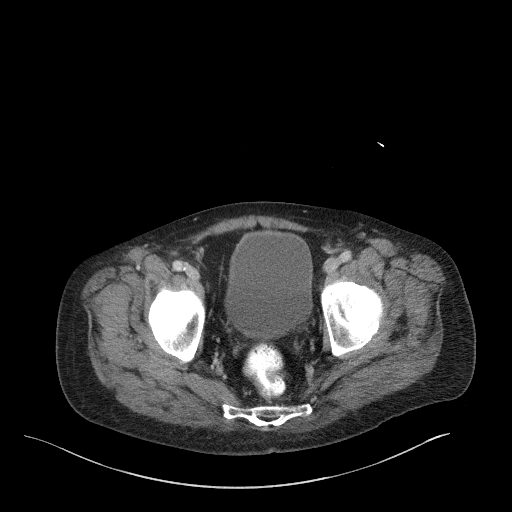
[im 30/86  soft-tissue]
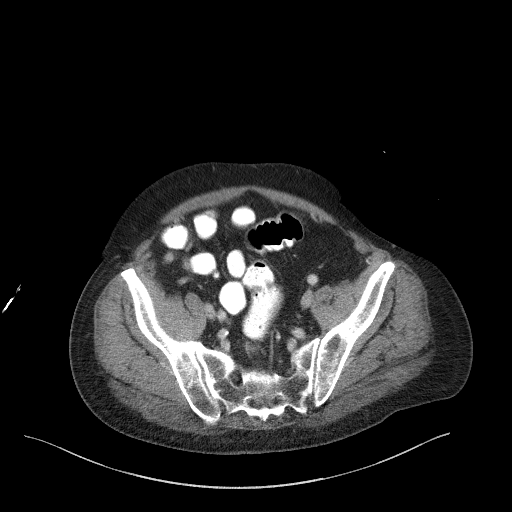
[im 35/86  soft-tissue]
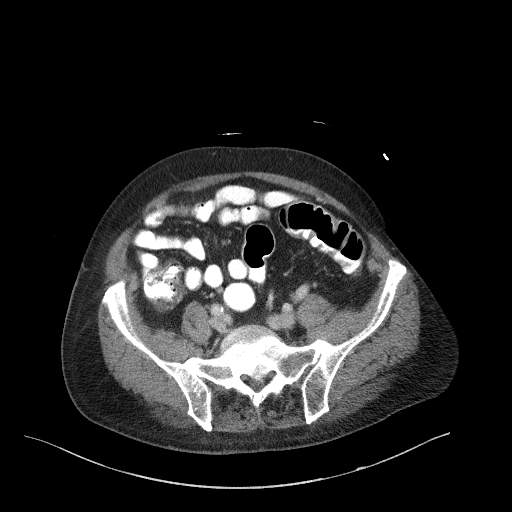
[im 39/86  soft-tissue]
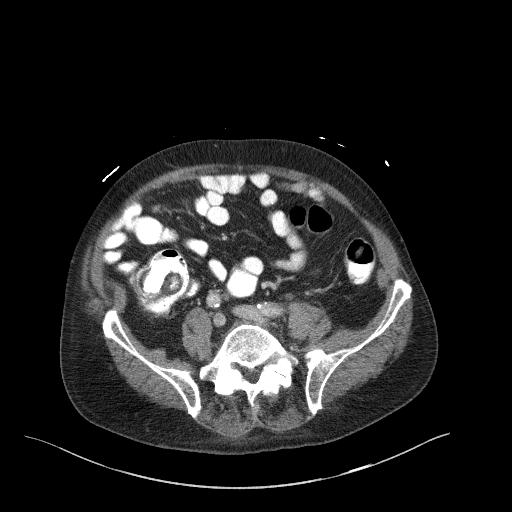
[im 47/86  soft-tissue]
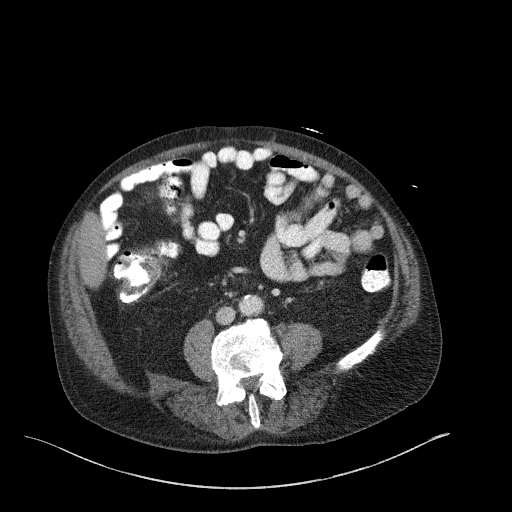
[im 52/86  soft-tissue]
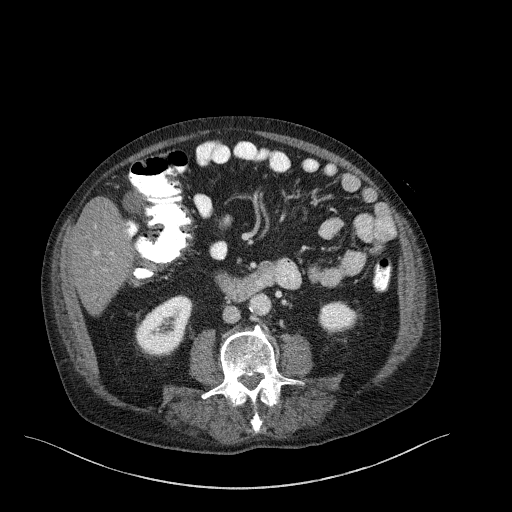
[im 52/86  bone]
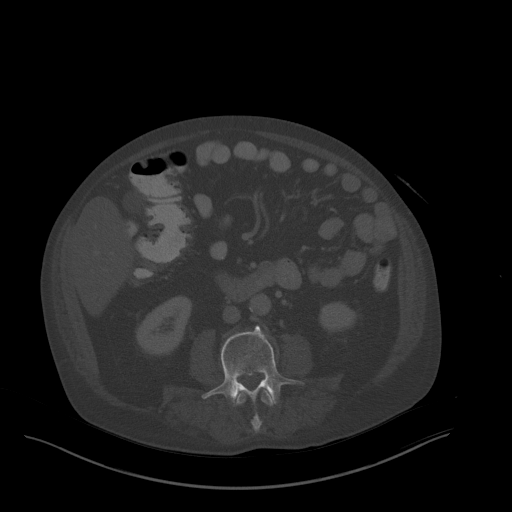
[im 56/86  soft-tissue]
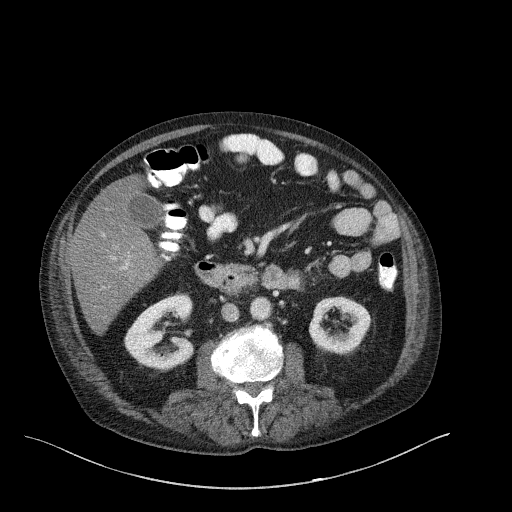
[im 64/86  soft-tissue]
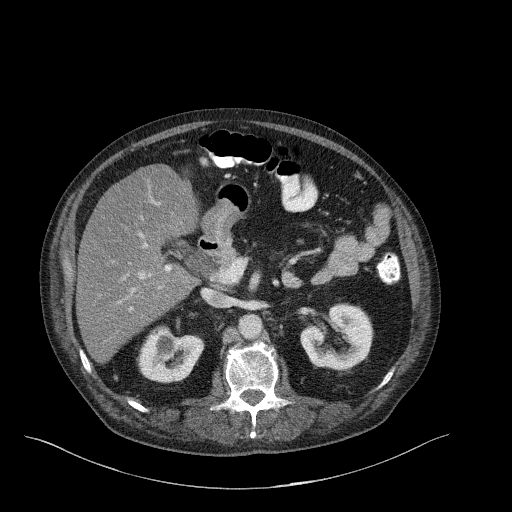
[im 69/86  soft-tissue]
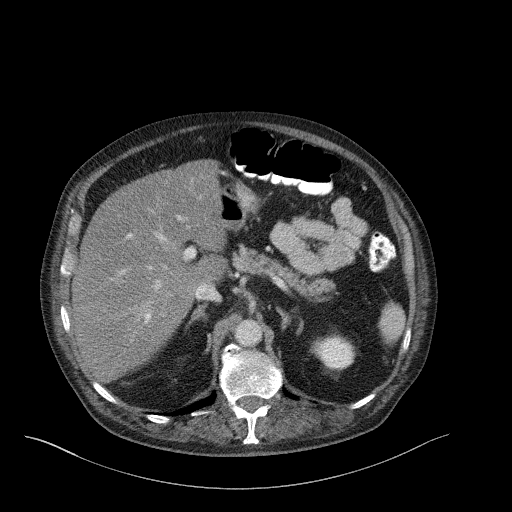
[im 73/86  soft-tissue]
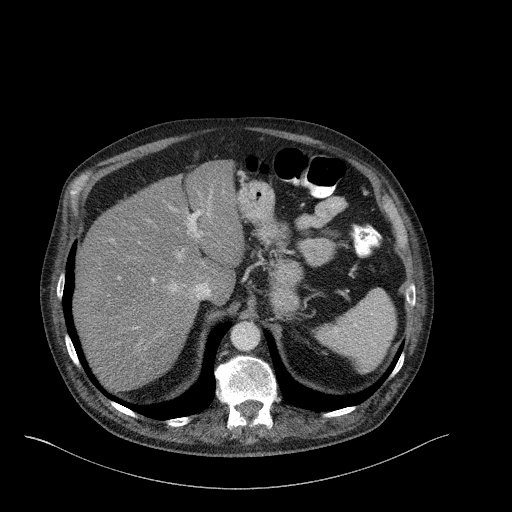
[im 81/86  soft-tissue]
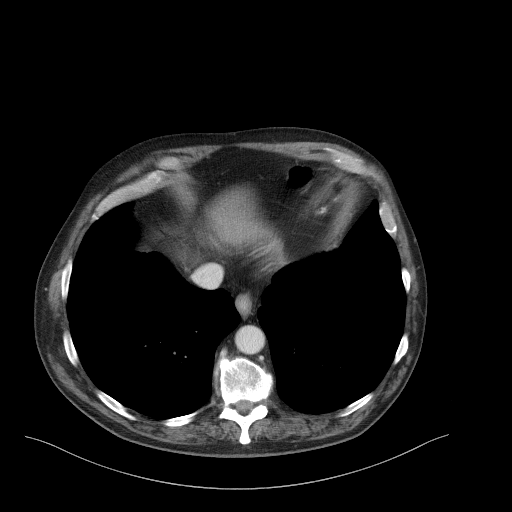

[Series 6: a/p w/ cor · coronal · 0.83mm/px · 3 of 157 slices shown]
[im 53/157  soft-tissue]
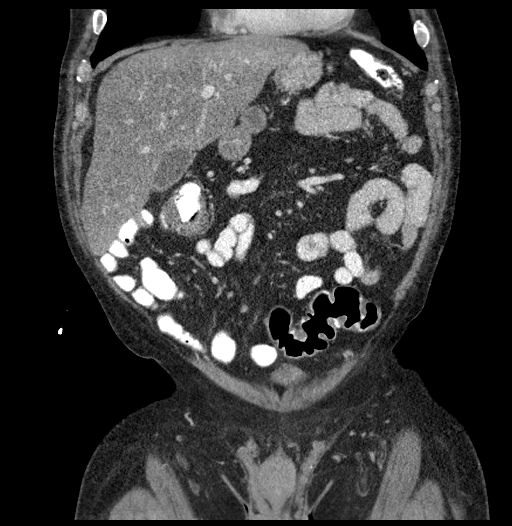
[im 70/157  soft-tissue]
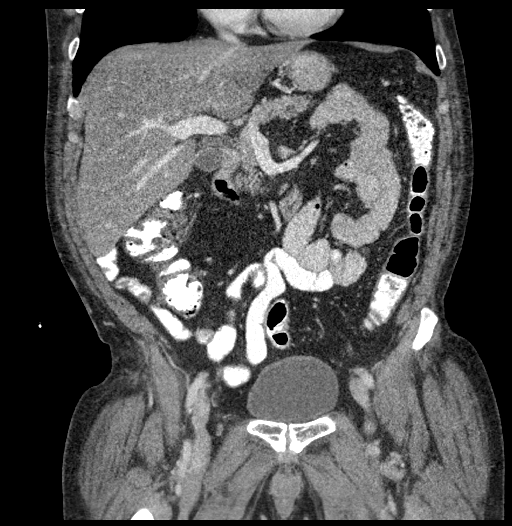
[im 87/157  soft-tissue]
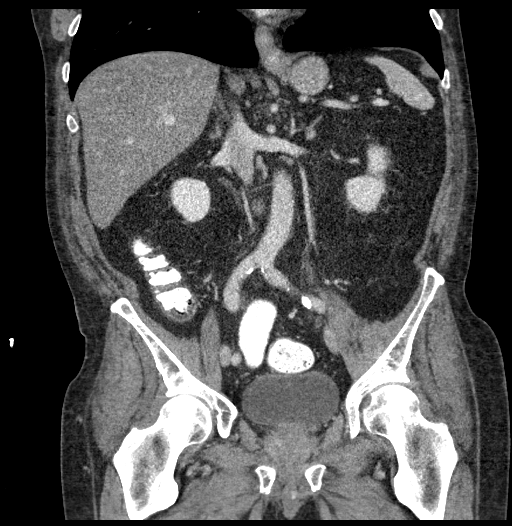

[17 of 46 positions shown; findings below may reference images not displayed]

FINDINGS: Lower chest: Clear lung bases. Normal sized heart.

Hepatobiliary: Diffuse low density of the liver relative to the
spleen. Unremarkable gallbladder.

Pancreas: Unremarkable. No pancreatic ductal dilatation or
surrounding inflammatory changes.

Spleen: Normal in size without focal abnormality.

Adrenals/Urinary Tract: Small right renal cortical cysts and small
left renal parapelvic cysts. Normal appearing adrenal glands,
ureters and urinary bladder.

Stomach/Bowel: The previously demonstrated proximal sigmoid colon
narrowing is no longer seen. The colon, appendix, small bowel and
stomach are unremarkable.

Vascular/Lymphatic: Atheromatous arterial calcifications without
aneurysm. No enlarged lymph nodes.

Reproductive: Mild to moderate enlargement of the prostate gland.

Other: Small right inguinal hernia containing fat and small
umbilical hernia containing fat.

Musculoskeletal: Lumbar and lower thoracic spine degenerative
changes and mild scoliosis. Stable old T11 and L4 vertebral
compression deformities. No acute fractures or subluxations.
IMPRESSION: 1. No acute abnormality.
2. Diffuse hepatic steatosis.
3. Mild to moderate prostatic hypertrophy.
4. Small right inguinal hernia containing fat and small umbilical
hernia containing fat.

## 2020-10-21 ENCOUNTER — Ambulatory Visit: Payer: Medicare Other | Admitting: Family Medicine

## 2020-11-04 ENCOUNTER — Ambulatory Visit: Payer: Medicare Other | Admitting: Family Medicine
